# Patient Record
Sex: Female | Born: 1995 | Race: White | Hispanic: No | Marital: Single | Smoking: Never smoker
Health system: Southern US, Community
[De-identification: ages and names within clinical notes are randomized; demographics above are authoritative.]

---

## 2015-05-31 ENCOUNTER — Ambulatory Visit: Payer: Self-pay | Admitting: Osteopathic Medicine

## 2015-06-01 ENCOUNTER — Ambulatory Visit: Payer: Self-pay | Admitting: Osteopathic Medicine

## 2015-11-30 ENCOUNTER — Encounter: Payer: Self-pay | Admitting: Osteopathic Medicine

## 2015-11-30 ENCOUNTER — Ambulatory Visit (INDEPENDENT_AMBULATORY_CARE_PROVIDER_SITE_OTHER): Payer: Managed Care, Other (non HMO) | Admitting: Osteopathic Medicine

## 2015-11-30 VITALS — BP 119/72 | HR 74 | Ht 65.0 in | Wt 149.0 lb

## 2015-11-30 DIAGNOSIS — Z30011 Encounter for initial prescription of contraceptive pills: Secondary | ICD-10-CM | POA: Diagnosis not present

## 2015-11-30 DIAGNOSIS — N3010 Interstitial cystitis (chronic) without hematuria: Secondary | ICD-10-CM | POA: Diagnosis not present

## 2015-11-30 LAB — POCT URINALYSIS DIPSTICK
BILIRUBIN UA: NEGATIVE
Blood, UA: NEGATIVE
GLUCOSE UA: NEGATIVE
Ketones, UA: NEGATIVE
LEUKOCYTES UA: NEGATIVE
NITRITE UA: NEGATIVE
PH UA: 6.5
Protein, UA: NEGATIVE
Spec Grav, UA: 1.025
Urobilinogen, UA: 0.2

## 2015-11-30 LAB — POCT URINE PREGNANCY: PREG TEST UR: NEGATIVE

## 2015-11-30 MED ORDER — AMITRIPTYLINE HCL 50 MG PO TABS: 50.0000 mg | ORAL_TABLET | Freq: Every day | ORAL | Status: DC

## 2015-11-30 MED ORDER — NORGESTIM-ETH ESTRAD TRIPHASIC 0.18/0.215/0.25 MG-25 MCG PO TABS: 1.0000 | ORAL_TABLET | Freq: Every day | ORAL | Status: DC

## 2015-11-30 MED ORDER — PHENAZOPYRIDINE HCL 200 MG PO TABS: 200.0000 mg | ORAL_TABLET | Freq: Three times a day (TID) | ORAL | Status: AC | PRN

## 2015-11-30 NOTE — Progress Notes (Signed)
HPI: Holly Ray is a 20 y.o. female who presents to [facility] today for chief complaint of:  Chief Complaint  Patient presents with  . Establish Care  . Contraception    Discuss contraception: Patient thinking about getting the arm implant, has a few questions about this and other methods. See below for assessment plan  Concern for interstitial cystitis: "issued with bladder" since about age 55 - 42, had to be home-schooled for some time. Saw two different urologists, went to specialist at [facility] and said nothing was wrong. Interstitial cystitis dx from her OBGYN. Was on Oxycodone and muscle relaxer. Never Elavil, antihistamines, pyridium.     Past medical, social and family history reviewed: History reviewed. No pertinent past medical history. History reviewed. No pertinent past surgical history. Social History  Substance Use Topics  . Smoking status: Never Smoker   . Smokeless tobacco: Not on file  . Alcohol Use: Not on file   History reviewed. No pertinent family history.  No current outpatient prescriptions on file.   No current facility-administered medications for this visit.   Allergies  Allergen Reactions  . Ceclor [Cefaclor] Rash      Review of Systems: CONSTITUTIONAL:  No  fever, no chills, No  unintentional weight changes HEAD/EYES/EARS/NOSE/THROAT: No  headache, no vision change, no hearing change, No  sore throat, No  sinus pressure CARDIAC: No  chest pain, No  pressure, No palpitations, No  orthopnea RESPIRATORY: No  cough, No  shortness of breath/wheeze GASTROINTESTINAL: No  nausea, No  vomiting, No  abdominal pain, No  blood in stool, No  diarrhea, No  constipation  MUSCULOSKELETAL: No  myalgia/arthralgia GENITOURINARY: No  incontinence, No  abnormal genital bleeding/discharge, (+) occasional dysuria SKIN: No  rash/wounds/concerning lesions HEM/ONC: No  easy bruising/bleeding, No  abnormal lymph node ENDOCRINE: No  polyuria/polydipsia/polyphagia, No  heat/cold intolerance  NEUROLOGIC: No  weakness, No  dizziness, No  slurred speech PSYCHIATRIC: No  concerns with depression, No  concerns with anxiety, No sleep problems  Exam:  BP 119/72 mmHg  Pulse 74  Ht  (1.651 m)  Wt 149 lb (67.586 kg)  BMI 24.79 kg/m2 Constitutional: VS see above. General Appearance: alert, well-developed, well-nourished, NAD Eyes: Nrmal lids and conjunctive, non-icteric sclera,  Ears, Nose, Mouth, Throat: MMM, Normal external inspection ears/nares/mouth/lips/gums,  Neck: No masses, trachea midline.  Musculoskeletal: Gait normal. No clubbing/cyanosis of digits.  Neurological: No cranial nerve deficit on limited exam.  Skin: warm, dry, intact. No rash/ulcer.  Psychiatric: Normal judgment/insight. Normal mood and affect. Oriented x3.    Results for orders placed or performed in visit on 11/30/15 (from the past 72 hour(s))  POCT urine pregnancy     Status: None   Collection Time: 11/30/15 10:00 AM  Result Value Ref Range   Preg Test, Ur Negative Negative      ASSESSMENT/PLAN: Plan for oral contraceptive pills, will order Nexplanon arm insert, patient advised of possible side effects of this medicine, does not protect against sexually transmitted infections, also educated on other birth control options such as Depo injection, NuvaRing, IUD options   Patient has what sounds like diagnosis of interstitial cystitis, possible inadequate treatment of sounds like she went from Dr. Dr. for some time. Will trial amitriptyline as well as Pyridium when necessary, if this is helping, good, if not, would consider pelvic floor physical therapy and possible urogynecology referral  Encounter for initial prescription of contraceptive pills - Plan: POCT urine pregnancy, phenazopyridine (PYRIDIUM) 200  MG tablet  Chronic interstitial cystitis - Plan: amitriptyline (ELAVIL) 50 MG tablet, Norgestimate-Ethinyl Estradiol Triphasic (ORTHO  TRI-CYCLEN LO) 0.18/0.215/0.25 MG-25 MCG tab, POCT Urinalysis Dipstick   Return sooner if needed, for Nexplanon insertion once this has arrived at the office.

## 2015-11-30 NOTE — Patient Instructions (Addendum)
We are starting birth control pills at this time, planning to order the Nexplanon arm insert, see below for further details about this medication. We will call you when it has arrived in the office and is ready for insertion.  As far as starting birth control pills, ideally would like to start these on the first day of your period. Or within 5 days of the first day of your period.   For presumed diagnosis of interstitial cystitis, prescribed an as needed medication to take for urinary pain, Pyridium, as well as another medication to take every night which will hopefully help with prevention, amitriptyline. We are starting a low-dose of this medication, it can cause sedation/somnolence, please let me know if you're having any problems with this. We'll start at half a tablet every night for 5-7 days and then increase to 1 tablet. We can go higher than the 50 mg but please talk to me before we decide to increase the dose.  Any questions or concerns, please call the office!    Etonogestrel implant What is this medicine? ETONOGESTREL (et oh noe JES trel) is a contraceptive (birth control) device. It is used to prevent pregnancy. It can be used for up to 3 years. This medicine may be used for other purposes; ask your health care provider or pharmacist if you have questions. What should I tell my health care provider before I take this medicine? They need to know if you have any of these conditions: -abnormal vaginal bleeding -blood vessel disease or blood clots -cancer of the breast, cervix, or liver -depression -diabetes -gallbladder disease -headaches -heart disease or recent heart attack -high blood pressure -high cholesterol -kidney disease -liver disease -renal disease -seizures -tobacco smoker -an unusual or allergic reaction to etonogestrel, other hormones, anesthetics or antiseptics, medicines, foods, dyes, or preservatives -pregnant or trying to get pregnant -breast-feeding How  should I use this medicine? This device is inserted just under the skin on the inner side of your upper arm by a health care professional. Talk to your pediatrician regarding the use of this medicine in children. Special care may be needed. Overdosage: If you think you have taken too much of this medicine contact a poison control center or emergency room at once. NOTE: This medicine is only for you. Do not share this medicine with others. What if I miss a dose? This does not apply. What may interact with this medicine? Do not take this medicine with any of the following medications: -amprenavir -bosentan -fosamprenavir This medicine may also interact with the following medications: -barbiturate medicines for inducing sleep or treating seizures -certain medicines for fungal infections like ketoconazole and itraconazole -griseofulvin -medicines to treat seizures like carbamazepine, felbamate, oxcarbazepine, phenytoin, topiramate -modafinil -phenylbutazone -rifampin -some medicines to treat HIV infection like atazanavir, indinavir, lopinavir, nelfinavir, tipranavir, ritonavir -St. John's wort This list may not describe all possible interactions. Give your health care provider a list of all the medicines, herbs, non-prescription drugs, or dietary supplements you use. Also tell them if you smoke, drink alcohol, or use illegal drugs. Some items may interact with your medicine. What should I watch for while using this medicine? This product does not protect you against HIV infection (AIDS) or other sexually transmitted diseases. You should be able to feel the implant by pressing your fingertips over the skin where it was inserted. Contact your doctor if you cannot feel the implant, and use a non-hormonal birth control method (such as condoms) until your doctor confirms that  the implant is in place. If you feel that the implant may have broken or become bent while in your arm, contact your  healthcare provider. What side effects may I notice from receiving this medicine? Side effects that you should report to your doctor or health care professional as soon as possible: -allergic reactions like skin rash, itching or hives, swelling of the face, lips, or tongue -breast lumps -changes in emotions or moods -depressed mood -heavy or prolonged menstrual bleeding -pain, irritation, swelling, or bruising at the insertion site -scar at site of insertion -signs of infection at the insertion site such as fever, and skin redness, pain or discharge -signs of pregnancy -signs and symptoms of a blood clot such as breathing problems; changes in vision; chest pain; severe, sudden headache; pain, swelling, warmth in the leg; trouble speaking; sudden numbness or weakness of the face, arm or leg -signs and symptoms of liver injury like dark yellow or brown urine; general ill feeling or flu-like symptoms; light-colored stools; loss of appetite; nausea; right upper belly pain; unusually weak or tired; yellowing of the eyes or skin -unusual vaginal bleeding, discharge -signs and symptoms of a stroke like changes in vision; confusion; trouble speaking or understanding; severe headaches; sudden numbness or weakness of the face, arm or leg; trouble walking; dizziness; loss of balance or coordination Side effects that usually do not require medical attention (Report these to your doctor or health care professional if they continue or are bothersome.): -acne -back pain -breast pain -changes in weight -dizziness -general ill feeling or flu-like symptoms -headache -irregular menstrual bleeding -nausea -sore throat -vaginal irritation or inflammation This list may not describe all possible side effects. Call your doctor for medical advice about side effects. You may report side effects to FDA at 1-800-FDA-1088. Where should I keep my medicine? This drug is given in a hospital or clinic and will not be  stored at home. NOTE: This sheet is a summary. It may not cover all possible information. If you have questions about this medicine, talk to your doctor, pharmacist, or health care provider.    2016, Elsevier/Gold Standard. (2014-06-05 14:07:06)

## 2015-12-28 ENCOUNTER — Telehealth: Payer: Self-pay

## 2015-12-28 NOTE — Telephone Encounter (Signed)
Patient had called left a message inquiring about Nexplanon insertion. I called her back to advise that we had it here but was unable to speak to patient or leave a message due to patient vm being full or not set up. If patient calls back she is to schedule an appointment for 30 mins to have this done. Rhonda Cunningham,CMA

## 2016-01-18 ENCOUNTER — Ambulatory Visit: Payer: Managed Care, Other (non HMO) | Admitting: Osteopathic Medicine

## 2016-01-27 ENCOUNTER — Ambulatory Visit (INDEPENDENT_AMBULATORY_CARE_PROVIDER_SITE_OTHER): Payer: BLUE CROSS/BLUE SHIELD | Admitting: Osteopathic Medicine

## 2016-01-27 ENCOUNTER — Encounter: Payer: Self-pay | Admitting: Osteopathic Medicine

## 2016-01-27 VITALS — BP 135/81 | HR 81 | Ht 65.0 in | Wt 145.0 lb

## 2016-01-27 DIAGNOSIS — Z30011 Encounter for initial prescription of contraceptive pills: Secondary | ICD-10-CM

## 2016-01-27 MED ORDER — NORGESTREL-ETHINYL ESTRADIOL 0.3-30 MG-MCG PO TABS: 1.0000 | ORAL_TABLET | Freq: Every day | ORAL | Status: DC

## 2016-01-27 NOTE — Patient Instructions (Signed)
Finish current month of pills - on first day of your next period, start the new medicine.

## 2016-01-27 NOTE — Progress Notes (Signed)
HPI: Holly Ray is a 20 y.o. female who presents to [facility] [place] today for chief complaint of:  Chief Complaint  Patient presents with  . Contraception    Discuss contraception: Patient thinking about getting the arm implant, has a few questions about this and other methods. We started Ortho-Tri-Cyclen Lo - causing some moodiness.    Past medical, social and family history reviewed: No past medical history on file. No past surgical history on file. Social History  Substance Use Topics  . Smoking status: Never Smoker   . Smokeless tobacco: Not on file  . Alcohol Use: Not on file   No family history on file.  Current Outpatient Prescriptions  Medication Sig Dispense Refill  . amitriptyline (ELAVIL) 50 MG tablet Take 1 tablet (50 mg total) by mouth at bedtime. (Start at 1/2 tablet every night for 5 - 7 days then increase to 1 tablet every night) 30 tablet 1  . Norgestimate-Ethinyl Estradiol Triphasic (ORTHO TRI-CYCLEN LO) 0.18/0.215/0.25 MG-25 MCG tab Take 1 tablet by mouth daily. 1 Package 11   No current facility-administered medications for this visit.   Allergies  Allergen Reactions  . Ceclor [Cefaclor] Rash      Review of Systems: CONSTITUTIONAL:  No recent illness MUSCULOSKELETAL: No  myalgia/arthralgia GENITOURINARY: No  abnormal genital bleeding/discharge, no crampy/heavy periods PSYCHIATRIC: No  concerns with depression, No  concerns with anxiety, No sleep problems, (+) mood swings, ittirability  Exam:  BP 135/81 mmHg  Pulse 81  Ht 5\' 5"  (1.651 m)  Wt 145 lb (65.772 kg)  BMI 24.13 kg/m2 Constitutional: VS see above. General Appearance: alert, well-developed, well-nourished, NAD Eyes: Nrmal lids and conjunctive, non-icteric sclera,  Ears, Nose, Mouth, Throat: MMM,   Neck: No masses, trachea midline.  Musculoskeletal: Gait normal.  Skin: warm, dry, intact. No rash/ulcer.  Psychiatric: Normal judgment/insight. Normal mood  and affect. Oriented x3.     No results found for this or any previous visit (from the past 72 hour(s)).    ASSESSMENT/PLAN: Patient decided against Nexplanon, but would like to switch OCP. Ok to switch today, advised some moodiness can be expected with any OCP but shouldn't last more than one or two packs. Any questions, let me know. Advised will need Pap age 20.   Encounter for initial prescription of contraceptive pills - Plan: norgestrel-ethinyl estradiol (CRYSELLE-28) 0.3-30 MG-MCG tablet   Return as needed.

## 2016-02-15 ENCOUNTER — Ambulatory Visit (INDEPENDENT_AMBULATORY_CARE_PROVIDER_SITE_OTHER): Payer: BLUE CROSS/BLUE SHIELD | Admitting: Osteopathic Medicine

## 2016-02-15 VITALS — BP 146/79 | HR 85 | Wt 139.0 lb

## 2016-02-15 DIAGNOSIS — L02419 Cutaneous abscess of limb, unspecified: Secondary | ICD-10-CM | POA: Diagnosis not present

## 2016-02-15 DIAGNOSIS — L03119 Cellulitis of unspecified part of limb: Secondary | ICD-10-CM | POA: Diagnosis not present

## 2016-02-15 DIAGNOSIS — N3010 Interstitial cystitis (chronic) without hematuria: Secondary | ICD-10-CM | POA: Diagnosis not present

## 2016-02-15 MED ORDER — OXYCODONE-ACETAMINOPHEN 5-325 MG PO TABS: 1.0000 | ORAL_TABLET | ORAL | Status: DC | PRN

## 2016-02-15 MED ORDER — AMITRIPTYLINE HCL 50 MG PO TABS: 25.0000 mg | ORAL_TABLET | Freq: Every evening | ORAL | Status: DC | PRN

## 2016-02-15 MED ORDER — CLINDAMYCIN HCL 300 MG PO CAPS: 300.0000 mg | ORAL_CAPSULE | Freq: Three times a day (TID) | ORAL | Status: DC

## 2016-02-15 NOTE — Progress Notes (Signed)
HPI: Holly Ray is a 20 y.o. female who presents to [facility] [place] today for chief complaint of:  Chief Complaint  Patient presents with  . Cyst     . Location: posterior thigh below L buttock . Quality: swollen, painful, draining pus and blood . Duration: few days . Modifying factors: able to drain it some at home but no tmuch . Assoc signs/symptoms: no fever/chills or joint pain   Past medical, social and family history reviewed: No past medical history on file. No past surgical history on file. Social History  Substance Use Topics  . Smoking status: Never Smoker   . Smokeless tobacco: Not on file  . Alcohol Use: Not on file   No family history on file.  Current Outpatient Prescriptions  Medication Sig Dispense Refill  . amitriptyline (ELAVIL) 50 MG tablet Take 0.5-1 tablets (25-50 mg total) by mouth at bedtime as needed (interstitial cystitis pain). 30 tablet 6  . norgestrel-ethinyl estradiol (CRYSELLE-28) 0.3-30 MG-MCG tablet Take 1 tablet by mouth daily. 1 Package 11  . clindamycin (CLEOCIN) 300 MG capsule Take 1 capsule (300 mg total) by mouth 3 (three) times daily. 21 capsule 0  . oxyCODONE-acetaminophen (PERCOCET/ROXICET) 5-325 MG tablet Take 1 tablet by mouth every 4 (four) hours as needed for severe pain. 20 tablet 0   No current facility-administered medications for this visit.   Allergies  Allergen Reactions  . Ceclor [Cefaclor] Rash      Review of Systems: CONSTITUTIONAL:  No  fever, no chills, No recent illness,  GASTROINTESTINAL: No  nausea, No  vomiting, No  abdominal pain, MUSCULOSKELETAL: No  myalgia/arthralgia GENITOURINARY: No  incontinence, No  abnormal genital bleeding/discharge SKIN: (+) rash/wounds/concerning lesions  Exam:  BP 146/79 mmHg  Pulse 85  Wt 139 lb (63.05 kg) Constitutional: VS see above. General Appearance: alert, well-developed, well-nourished, NAD Skin: abscess below L buttock, significant  amount of blood and purulent material able to be manually expressed, approx 3 - 4 mL. Skin otherwise warm, dry, intact. No rash/ulcer. No concerning nevi or subq nodules on limited exam.   Psychiatric: Normal judgment/insight. Normal mood and affect. Oriented x3.      ASSESSMENT/PLAN: Pt would rather try po abx and warm compresses than I&D at this point, given that I was able to express a significant amount of fluid/pus from the abscess this may have been sufficient, will trial po abx and warm compresses with pain control, wound culture pending, RTC/ER precautions discussed at length. Pt advised we may be missing deeper pockets of infection without doing I&D and exploration of the abscess, and we may need to do this and place some packing if no better in the next day or so. Pt agreeable to plan and followup as directed.   Cellulitis and abscess of leg - Plan: Wound culture, clindamycin (CLEOCIN) 300 MG capsule, oxyCODONE-acetaminophen (PERCOCET/ROXICET) 5-325 MG tablet  Chronic interstitial cystitis - Plan: amitriptyline (ELAVIL) 50 MG tablet   All questions were answered. Visit summary with medication list and pertinent instructions was printed for patient to review. ER/RTC precautions were reviewed with the patient. Return if symptoms worsen or fail to improve.  Total time spent 25 minutes, greater than 50% of the visit was face to face counseling for diagnosis of abscess.

## 2016-02-15 NOTE — Patient Instructions (Signed)
Abscess An abscess is an infected area that contains a collection of pus and debris.It can occur in almost any part of the body. An abscess is also known as a furuncle or boil. CAUSES  An abscess occurs when tissue gets infected. This can occur from blockage of oil or sweat glands, infection of hair follicles, or a minor injury to the skin. As the body tries to fight the infection, pus collects in the area and creates pressure under the skin. This pressure causes pain. People with weakened immune systems have difficulty fighting infections and get certain abscesses more often.  SYMPTOMS Usually an abscess develops on the skin and becomes a painful mass that is red, warm, and tender. If the abscess forms under the skin, you may feel a moveable soft area under the skin. Some abscesses break open (rupture) on their own, but most will continue to get worse without care. The infection can spread deeper into the body and eventually into the bloodstream, causing you to feel ill.  DIAGNOSIS  Your caregiver will take your medical history and perform a physical exam. A sample of fluid may also be taken from the abscess to determine what is causing your infection. TREATMENT  Your caregiver may prescribe antibiotic medicines to fight the infection. However, taking antibiotics alone usually does not cure an abscess. Your caregiver may need to make a small cut (incision) in the abscess to drain the pus. In some cases, gauze is packed into the abscess to reduce pain and to continue draining the area. HOME CARE INSTRUCTIONS   Only take over-the-counter or prescription medicines for pain, discomfort, or fever as directed by your caregiver: Ibuprofen 400 - 800 mg every 6 hours, use the Oxycodone-Acetaminophen for severe pain.   Use warm compresses directly o the skin several times per day to promote drainage.   If you were prescribed antibiotics, take them as directed. Finish them even if you start to feel  better.  If gauze is used, follow your caregiver's directions for changing the gauze.  To avoid spreading the infection:  Keep your draining abscess covered with a bandage.  Wash your hands well.  Do not share personal care items, towels, or whirlpools with others.  Avoid skin contact with others.  Keep your skin and clothes clean around the abscess.  Keep all follow-up appointments as directed by your caregiver. SEEK MEDICAL CARE IF:   You have increased pain, swelling, redness, fluid drainage, or bleeding.  You have muscle aches, chills, or a general ill feeling.  You have a fever. MAKE SURE YOU:   Understand these instructions.  Will watch your condition.  Will get help right away if you are not doing well or get worse.   This information is not intended to replace advice given to you by your health care provider. Make sure you discuss any questions you have with your health care provider.   Document Released: 05/31/2005 Document Revised: 02/20/2012 Document Reviewed: 11/03/2011 Elsevier Interactive Patient Education [employer]2016 Elsevier Inc.

## 2016-02-24 ENCOUNTER — Telehealth: Payer: Self-pay

## 2016-02-24 MED ORDER — PHENAZOPYRIDINE HCL 200 MG PO TABS: 200.0000 mg | ORAL_TABLET | Freq: Three times a day (TID) | ORAL | Status: DC | PRN

## 2016-02-24 NOTE — Telephone Encounter (Signed)
Spoke to patient advised her that medication has been sent to her pharmacy. Holly Ray,CMA

## 2016-02-24 NOTE — Telephone Encounter (Signed)
Holly Ray called and states she would like Pyridium for her painful urination.

## 2016-02-24 NOTE — Telephone Encounter (Signed)
Sent it in! Let me know if any problems.

## 2016-03-01 ENCOUNTER — Telehealth: Payer: Self-pay

## 2016-03-01 MED ORDER — PHENAZOPYRIDINE HCL 200 MG PO TABS: 200.0000 mg | ORAL_TABLET | Freq: Three times a day (TID) | ORAL | Status: DC | PRN

## 2016-03-01 NOTE — Telephone Encounter (Signed)
Patient called stated that the pharmacy never received Rx for Pyridium 200 mg. So I checked it and it was done under no print tab so I resent it to the pharmacy. Rhonda Cunningham,CMA

## 2016-04-04 ENCOUNTER — Ambulatory Visit: Payer: BLUE CROSS/BLUE SHIELD | Admitting: Osteopathic Medicine

## 2016-12-19 ENCOUNTER — Ambulatory Visit (INDEPENDENT_AMBULATORY_CARE_PROVIDER_SITE_OTHER): Payer: Medicaid Other | Admitting: Osteopathic Medicine

## 2016-12-19 ENCOUNTER — Telehealth: Payer: Self-pay | Admitting: Osteopathic Medicine

## 2016-12-19 ENCOUNTER — Encounter: Payer: Self-pay | Admitting: Osteopathic Medicine

## 2016-12-19 VITALS — BP 126/65 | HR 70 | Ht 65.0 in | Wt 164.0 lb

## 2016-12-19 DIAGNOSIS — Z3009 Encounter for other general counseling and advice on contraception: Secondary | ICD-10-CM

## 2016-12-19 DIAGNOSIS — N3010 Interstitial cystitis (chronic) without hematuria: Secondary | ICD-10-CM

## 2016-12-19 MED ORDER — AMITRIPTYLINE HCL 50 MG PO TABS: 25.0000 mg | ORAL_TABLET | Freq: Every evening | ORAL | 1 refills | Status: DC | PRN

## 2016-12-19 NOTE — Telephone Encounter (Signed)
Most excellent. Can we make sure we have a Nexplanon device around and then schedule the patient? Medicaid used to be tricky about coverage for these so thanks for looking into this.

## 2016-12-19 NOTE — Telephone Encounter (Signed)
Called Medicaid benefits call center [phone]), spoke with Alisia. Was advised Pt has active and full Medicaid coverage and to visit website [email or web address]) and click on Family planning document ([email or web address]) to determine coverage details. Call reference #: [id number].  Per website IUD will be covered:  3.2 Specific Criteria Covered According to 42 C.F.R.  441.20, for Medicaid family planning services: "For beneficiaries eligible under the plan for family planning services, the plan must provide that each beneficiary is free from coercion or mental pressure and free to choose the method of family planning to be used." 3.2.1 Specific Criteria Covered by Medicaid FP, [facility] and "Be Smart" Medicaid FP, [facility] and "Be Smart" shall cover family planning services, including consultation, examination, and treatment prescribed by a physician, nurse midwife, or nurse practitioner, or furnished by or under the physician's supervision. Family planning services include laboratory tests, and FDA approved methods, supplies, and devices to prevent conception, as follows: a. The "fitting" of diaphragms; b. Birth control pills; c. Intrauterine Devices (IUD's) (including Mirena, Paragard, and Skyla); d. Contraceptive injections (including Depo-Provera); e. Implantable contraceptive devices (including Implanon and Nexplanon); f. Contraceptive patch (including Ortho Evra); g. Contraceptive ring (including Nuva Ring); h. Emergency Contraception (including Plan B and Ella); i. Screening, early detection and education for Sexually Transmitted Infections (STIs), including Human Immunodeficiency Virus/ Acquired Immune Deficiency Syndrome (HIV/AIDS); j. Treatment for STIs; and k. Lab services (refer to Attachment A, Section C, Item 1)

## 2016-12-19 NOTE — Telephone Encounter (Signed)
-----   Message from Sunnie Nielsen, DO sent at 12/19/2016  1:55 PM EDT ----- Regarding: Nexplanon Can we see about Medicaid approval for Nexplanon? Thanks

## 2016-12-19 NOTE — Progress Notes (Signed)
HPI: Holly Ray is a 21 y.o. female  who presents to [name] today, 12/19/16,  for chief complaint of:  Chief Complaint  Patient presents with  . Contraception    Patient here for discussion regarding contraception. Is unable to remember to take pills on a daily basis, pregnancy resulted, currently 4 weeks postpartum and would like to have Nexplanon placed. She is also considering IUD.  Bladder pain/history of interstitial cystitis: Previously on amitriptyline and this is very helpful, would like refill of this medication.   Past medical, surgical, social and family history reviewed: Patient Active Problem List   Diagnosis Date Noted  . Cellulitis and abscess of leg 02/15/2016  . Encounter for initial prescription of contraceptive pills 11/30/2015  . Chronic interstitial cystitis 11/30/2015   No past surgical history on file. Social History  Substance Use Topics  . Smoking status: Never Smoker  . Smokeless tobacco: Never Used  . Alcohol use Not on file   No family history on file.   Current medication list and allergy/intolerance information reviewed:   Current Outpatient Prescriptions  Medication Sig Dispense Refill  . amitriptyline (ELAVIL) 50 MG tablet Take 0.5-1 tablets (25-50 mg total) by mouth at bedtime as needed (interstitial cystitis pain). 90 tablet 1   No current facility-administered medications for this visit.    Allergies  Allergen Reactions  . Ceclor [Cefaclor] Rash      Review of Systems:  Constitutional:  No  fever, no chills, No recent illness, feels well today   Genitourinary: No  incontinence, No  abnormal genital bleeding, No abnormal genital discharge  Skin: No  Rash, No other wounds/concerning lesions  Exam:  BP 126/65   Pulse 70   Ht  (1.651 m)   Wt 164 lb (74.4 kg)   BMI 27.29 kg/m   Constitutional: VS see above. General Appearance: alert, well-developed, well-nourished,  NAD  Psychiatric: Normal judgment/insight. Normal mood and affect. Oriented x3.    ASSESSMENT/PLAN:   Discussed options for long-acting reversible contraception including Nexplanon and IUD. Patient is leaning toward Nexplanon. We'll confirm Medicaid approval and plan for insertion of patient's earliest convenience.   Encounter for counseling regarding contraception  Chronic interstitial cystitis - Plan: amitriptyline (ELAVIL) 50 MG tablet, DISCONTINUED: amitriptyline (ELAVIL) 50 MG tablet       Visit summary with medication list and pertinent instructions was printed for patient to review. All questions at time of visit were answered - patient instructed to contact office with any additional concerns. ER/RTC precautions were reviewed with the patient. Follow-up plan: Return for contraception insertion once approved .  Note: Total time spent 15 minutes, greater than 50% of the visit was spent face-to-face counseling and coordinating care for the following: The primary encounter diagnosis was Encounter for counseling regarding contraception. A diagnosis of Chronic interstitial cystitis was also pertinent to this visit.Marland Kitchen

## 2016-12-19 NOTE — Patient Instructions (Addendum)
Etonogestrel implant What is this medicine? ETONOGESTREL (et oh noe JES trel) is a contraceptive (birth control) device. It is used to prevent pregnancy. It can be used for up to 3 years. This medicine may be used for other purposes; ask your health care provider or pharmacist if you have questions. COMMON BRAND NAME(S): Implanon, Nexplanon What should I tell my health care provider before I take this medicine? They need to know if you have any of these conditions: -abnormal vaginal bleeding -blood vessel disease or blood clots -cancer of the breast, cervix, or liver -depression -diabetes -gallbladder disease -headaches -heart disease or recent heart attack -high blood pressure -high cholesterol -kidney disease -liver disease -renal disease -seizures -tobacco smoker -an unusual or allergic reaction to etonogestrel, other hormones, anesthetics or antiseptics, medicines, foods, dyes, or preservatives -pregnant or trying to get pregnant -breast-feeding How should I use this medicine? This device is inserted just under the skin on the inner side of your upper arm by a health care professional. Talk to your pediatrician regarding the use of this medicine in children. Special care may be needed. Overdosage: If you think you have taken too much of this medicine contact a poison control center or emergency room at once. NOTE: This medicine is only for you. Do not share this medicine with others. What if I miss a dose? This does not apply. What may interact with this medicine? Do not take this medicine with any of the following medications: -amprenavir -bosentan -fosamprenavir This medicine may also interact with the following medications: -barbiturate medicines for inducing sleep or treating seizures -certain medicines for fungal infections like ketoconazole and itraconazole -grapefruit juice -griseofulvin -medicines to treat seizures like carbamazepine, felbamate, oxcarbazepine,  phenytoin, topiramate -modafinil -phenylbutazone -rifampin -rufinamide -some medicines to treat HIV infection like atazanavir, indinavir, lopinavir, nelfinavir, tipranavir, ritonavir -St. John's wort This list may not describe all possible interactions. Give your health care provider a list of all the medicines, herbs, non-prescription drugs, or dietary supplements you use. Also tell them if you smoke, drink alcohol, or use illegal drugs. Some items may interact with your medicine. What should I watch for while using this medicine? This product does not protect you against HIV infection (AIDS) or other sexually transmitted diseases. You should be able to feel the implant by pressing your fingertips over the skin where it was inserted. Contact your doctor if you cannot feel the implant, and use a non-hormonal birth control method (such as condoms) until your doctor confirms that the implant is in place. If you feel that the implant may have broken or become bent while in your arm, contact your healthcare provider. What side effects may I notice from receiving this medicine? Side effects that you should report to your doctor or health care professional as soon as possible: -allergic reactions like skin rash, itching or hives, swelling of the face, lips, or tongue -breast lumps -changes in emotions or moods -depressed mood -heavy or prolonged menstrual bleeding -pain, irritation, swelling, or bruising at the insertion site -scar at site of insertion -signs of infection at the insertion site such as fever, and skin redness, pain or discharge -signs of pregnancy -signs and symptoms of a blood clot such as breathing problems; changes in vision; chest pain; severe, sudden headache; pain, swelling, warmth in the leg; trouble speaking; sudden numbness or weakness of the face, arm or leg -signs and symptoms of liver injury like dark yellow or brown urine; general ill feeling or flu-like symptoms;  light-colored   stools; loss of appetite; nausea; right upper belly pain; unusually weak or tired; yellowing of the eyes or skin -unusual vaginal bleeding, discharge -signs and symptoms of a stroke like changes in vision; confusion; trouble speaking or understanding; severe headaches; sudden numbness or weakness of the face, arm or leg; trouble walking; dizziness; loss of balance or coordination Side effects that usually do not require medical attention (report to your doctor or health care professional if they continue or are bothersome): -acne -back pain -breast pain -changes in weight -dizziness -general ill feeling or flu-like symptoms -headache -irregular menstrual bleeding -nausea -sore throat -vaginal irritation or inflammation This list may not describe all possible side effects. Call your doctor for medical advice about side effects. You may report side effects to FDA at 1-800-FDA-1088. Where should I keep my medicine? This drug is given in a hospital or clinic and will not be stored at home. NOTE: This sheet is a summary. It may not cover all possible information. If you have questions about this medicine, talk to your doctor, pharmacist, or health care provider.  2018 Elsevier/Gold Standard (2016-03-09 11:19:22)   Levonorgestrel intrauterine device (IUD) What is this medicine? LEVONORGESTREL IUD (LEE voe nor jes trel) is a contraceptive (birth control) device. The device is placed inside the uterus by a healthcare professional. It is used to prevent pregnancy. This device can also be used to treat heavy bleeding that occurs during your period. This medicine may be used for other purposes; ask your health care provider or pharmacist if you have questions. COMMON BRAND NAME(S): Cameron Ali What should I tell my health care provider before I take this medicine? They need to know if you have any of these conditions: -abnormal Pap smear -cancer of the breast,  uterus, or cervix -diabetes -endometritis -genital or pelvic infection now or in the past -have more than one sexual partner or your partner has more than one partner -heart disease -history of an ectopic or tubal pregnancy -immune system problems -IUD in place -liver disease or tumor -problems with blood clots or take blood-thinners -seizures -use intravenous drugs -uterus of unusual shape -vaginal bleeding that has not been explained -an unusual or allergic reaction to levonorgestrel, other hormones, silicone, or polyethylene, medicines, foods, dyes, or preservatives -pregnant or trying to get pregnant -breast-feeding How should I use this medicine? This device is placed inside the uterus by a health care professional. Talk to your pediatrician regarding the use of this medicine in children. Special care may be needed. Overdosage: If you think you have taken too much of this medicine contact a poison control center or emergency room at once. NOTE: This medicine is only for you. Do not share this medicine with others. What if I miss a dose? This does not apply. Depending on the brand of device you have inserted, the device will need to be replaced every 3 to 5 years if you wish to continue using this type of birth control. What may interact with this medicine? Do not take this medicine with any of the following medications: -amprenavir -bosentan -fosamprenavir This medicine may also interact with the following medications: -aprepitant -armodafinil -barbiturate medicines for inducing sleep or treating seizures -bexarotene -boceprevir -griseofulvin -medicines to treat seizures like carbamazepine, ethotoin, felbamate, oxcarbazepine, phenytoin, topiramate -modafinil -pioglitazone -rifabutin -rifampin -rifapentine -some medicines to treat HIV infection like atazanavir, efavirenz, indinavir, lopinavir, nelfinavir, tipranavir, ritonavir -St. John's wort -warfarin This list may  not describe all possible interactions. Give your health care provider  a list of all the medicines, herbs, non-prescription drugs, or dietary supplements you use. Also tell them if you smoke, drink alcohol, or use illegal drugs. Some items may interact with your medicine. What should I watch for while using this medicine? Visit your doctor or health care professional for regular check ups. See your doctor if you or your partner has sexual contact with others, becomes HIV positive, or gets a sexual transmitted disease. This product does not protect you against HIV infection (AIDS) or other sexually transmitted diseases. You can check the placement of the IUD yourself by reaching up to the top of your vagina with clean fingers to feel the threads. Do not pull on the threads. It is a good habit to check placement after each menstrual period. Call your doctor right away if you feel more of the IUD than just the threads or if you cannot feel the threads at all. The IUD may come out by itself. You may become pregnant if the device comes out. If you notice that the IUD has come out use a backup birth control method like condoms and call your health care provider. Using tampons will not change the position of the IUD and are okay to use during your period. This IUD can be safely scanned with magnetic resonance imaging (MRI) only under specific conditions. Before you have an MRI, tell your healthcare provider that you have an IUD in place, and which type of IUD you have in place. What side effects may I notice from receiving this medicine? Side effects that you should report to your doctor or health care professional as soon as possible: -allergic reactions like skin rash, itching or hives, swelling of the face, lips, or tongue -fever, flu-like symptoms -genital sores -high blood pressure -no menstrual period for 6 weeks during use -pain, swelling, warmth in the leg -pelvic pain or tenderness -severe or  sudden headache -signs of pregnancy -stomach cramping -sudden shortness of breath -trouble with balance, talking, or walking -unusual vaginal bleeding, discharge -yellowing of the eyes or skin Side effects that usually do not require medical attention (report to your doctor or health care professional if they continue or are bothersome): -acne -breast pain -change in sex drive or performance -changes in weight -cramping, dizziness, or faintness while the device is being inserted -headache -irregular menstrual bleeding within first 3 to 6 months of use -nausea This list may not describe all possible side effects. Call your doctor for medical advice about side effects. You may report side effects to FDA at 1-800-FDA-1088. Where should I keep my medicine? This does not apply. NOTE: This sheet is a summary. It may not cover all possible information. If you have questions about this medicine, talk to your doctor, pharmacist, or health care provider.  2018 Elsevier/Gold Standard (2016-06-02 14:14:56)

## 2016-12-20 NOTE — Telephone Encounter (Signed)
Insurance verified, Rx here, Pt scheduled.

## 2016-12-21 ENCOUNTER — Ambulatory Visit: Payer: Medicaid Other | Admitting: Osteopathic Medicine

## 2016-12-22 ENCOUNTER — Ambulatory Visit: Payer: Medicaid Other | Admitting: Osteopathic Medicine

## 2016-12-26 ENCOUNTER — Ambulatory Visit (INDEPENDENT_AMBULATORY_CARE_PROVIDER_SITE_OTHER): Payer: Medicaid Other | Admitting: Osteopathic Medicine

## 2016-12-26 ENCOUNTER — Encounter: Payer: Self-pay | Admitting: Osteopathic Medicine

## 2016-12-26 VITALS — BP 120/73 | HR 81 | Ht 65.0 in | Wt 165.0 lb

## 2016-12-26 DIAGNOSIS — Z308 Encounter for other contraceptive management: Secondary | ICD-10-CM | POA: Diagnosis not present

## 2016-12-26 DIAGNOSIS — Z30017 Encounter for initial prescription of implantable subdermal contraceptive: Secondary | ICD-10-CM

## 2016-12-26 LAB — POCT URINE PREGNANCY: Preg Test, Ur: NEGATIVE

## 2016-12-26 MED ORDER — ETONOGESTREL 68 MG ~~LOC~~ IMPL: 1.0000 | DRUG_IMPLANT | Freq: Once | SUBCUTANEOUS | 0 refills | Status: DC

## 2016-12-26 NOTE — Patient Instructions (Signed)
Etonogestrel implant What is this medicine? ETONOGESTREL (et oh noe JES trel) is a contraceptive (birth control) device. It is used to prevent pregnancy. It can be used for up to 3 years. This medicine may be used for other purposes; ask your health care provider or pharmacist if you have questions. COMMON BRAND NAME(S): Implanon, Nexplanon What should I tell my health care provider before I take this medicine? They need to know if you have any of these conditions: -abnormal vaginal bleeding -blood vessel disease or blood clots -cancer of the breast, cervix, or liver -depression -diabetes -gallbladder disease -headaches -heart disease or recent heart attack -high blood pressure -high cholesterol -kidney disease -liver disease -renal disease -seizures -tobacco smoker -an unusual or allergic reaction to etonogestrel, other hormones, anesthetics or antiseptics, medicines, foods, dyes, or preservatives -pregnant or trying to get pregnant -breast-feeding How should I use this medicine? This device is inserted just under the skin on the inner side of your upper arm by a health care professional. Talk to your pediatrician regarding the use of this medicine in children. Special care may be needed. Overdosage: If you think you have taken too much of this medicine contact a poison control center or emergency room at once. NOTE: This medicine is only for you. Do not share this medicine with others. What if I miss a dose? This does not apply. What may interact with this medicine? Do not take this medicine with any of the following medications: -amprenavir -bosentan -fosamprenavir This medicine may also interact with the following medications: -barbiturate medicines for inducing sleep or treating seizures -certain medicines for fungal infections like ketoconazole and itraconazole -grapefruit juice -griseofulvin -medicines to treat seizures like carbamazepine, felbamate, oxcarbazepine,  phenytoin, topiramate -modafinil -phenylbutazone -rifampin -rufinamide -some medicines to treat HIV infection like atazanavir, indinavir, lopinavir, nelfinavir, tipranavir, ritonavir -St. John's wort This list may not describe all possible interactions. Give your health care provider a list of all the medicines, herbs, non-prescription drugs, or dietary supplements you use. Also tell them if you smoke, drink alcohol, or use illegal drugs. Some items may interact with your medicine. What should I watch for while using this medicine? This product does not protect you against HIV infection (AIDS) or other sexually transmitted diseases. You should be able to feel the implant by pressing your fingertips over the skin where it was inserted. Contact your doctor if you cannot feel the implant, and use a non-hormonal birth control method (such as condoms) until your doctor confirms that the implant is in place. If you feel that the implant may have broken or become bent while in your arm, contact your healthcare provider. What side effects may I notice from receiving this medicine? Side effects that you should report to your doctor or health care professional as soon as possible: -allergic reactions like skin rash, itching or hives, swelling of the face, lips, or tongue -breast lumps -changes in emotions or moods -depressed mood -heavy or prolonged menstrual bleeding -pain, irritation, swelling, or bruising at the insertion site -scar at site of insertion -signs of infection at the insertion site such as fever, and skin redness, pain or discharge -signs of pregnancy -signs and symptoms of a blood clot such as breathing problems; changes in vision; chest pain; severe, sudden headache; pain, swelling, warmth in the leg; trouble speaking; sudden numbness or weakness of the face, arm or leg -signs and symptoms of liver injury like dark yellow or brown urine; general ill feeling or flu-like symptoms;  light-colored   stools; loss of appetite; nausea; right upper belly pain; unusually weak or tired; yellowing of the eyes or skin -unusual vaginal bleeding, discharge -signs and symptoms of a stroke like changes in vision; confusion; trouble speaking or understanding; severe headaches; sudden numbness or weakness of the face, arm or leg; trouble walking; dizziness; loss of balance or coordination Side effects that usually do not require medical attention (report to your doctor or health care professional if they continue or are bothersome): -acne -back pain -breast pain -changes in weight -dizziness -general ill feeling or flu-like symptoms -headache -irregular menstrual bleeding -nausea -sore throat -vaginal irritation or inflammation This list may not describe all possible side effects. Call your doctor for medical advice about side effects. You may report side effects to FDA at 1-800-FDA-1088. Where should I keep my medicine? This drug is given in a hospital or clinic and will not be stored at home. NOTE: This sheet is a summary. It may not cover all possible information. If you have questions about this medicine, talk to your doctor, pharmacist, or health care provider.  2018 Elsevier/Gold Standard (2016-03-09 11:19:22)  

## 2016-12-26 NOTE — Progress Notes (Signed)
HPI: Holly Ray is a 21 y.o. female  who presents to [name] today, 12/26/16,  for chief complaint of:  Chief Complaint  Patient presents with  . Contraception    NEXPLANON    Here for Nexplanon insertion, see procedure note below.   Past medical history, surgical history, social history and family history reviewed.  Patient Active Problem List   Diagnosis Date Noted  . Cellulitis and abscess of leg 02/15/2016  . Encounter for initial prescription of contraceptive pills 11/30/2015  . Chronic interstitial cystitis 11/30/2015    Current medication list and allergy/intolerance information reviewed.   Current Outpatient Prescriptions on File Prior to Visit  Medication Sig Dispense Refill  . amitriptyline (ELAVIL) 50 MG tablet Take 0.5-1 tablets (25-50 mg total) by mouth at bedtime as needed (interstitial cystitis pain). 90 tablet 1   No current facility-administered medications on file prior to visit.    Allergies  Allergen Reactions  . Ceclor [Cefaclor] Rash      Review of Systems:  Constitutional: No recent illness, feels well today   HEENT: No  headache, no vision change  Neurologic: No  weakness, No  Dizziness   Exam:  BP 120/73   Pulse 81   Ht  (1.651 m)   Wt 165 lb (74.8 kg)   BMI 27.46 kg/m   Constitutional: VS see above. General Appearance: alert, well-developed, well-nourished, NAD  Skin: warm, dry, intact.     Recent Results (from the past 2160 hour(s))  POCT urine pregnancy     Status: None   Collection Time: 12/26/16 11:11 AM  Result Value Ref Range   Preg Test, Ur Negative Negative     ASSESSMENT/PLAN:    Insertion of Nexplanon - Consent on file, card provided and after-care instructions reviewed. Pt to contact us with any questions/concerns. Advised backup method contrception x7 days - Plan: etonogestrel (NEXPLANON) 68 MG IMPL implant  Encounter for other contraceptive management - Plan:  POCT urine pregnancy    Follow-up plan: Return for any concern for infection or other problems with the device, otherwise in 3 years for removal.  Visit summary with medication list and pertinent instructions was printed for patient to review, alert [place] if any changes needed. All questions at time of visit were answered - patient instructed to contact office with any additional concerns. ER/RTC precautions were reviewed with the patient and understanding verbalized.   Note: Total time spent 15 minutes, greater than 50% of the visit was spent face-to-face counseling and coordinating care for the following: The encounter diagnosis was Encounter for other contraceptive management.Marland Kitchen   NEXPLANON INSERTION PRE-OP DIAGNOSIS: desired long-term, reversible contraception  POST-OP DIAGNOSIS: Same  PROCEDURE: Nexplanon  placement Performing Physician: Dr. Sunnie Nielsen  Risks and benefits of procedure and of this method of contraception reviewed with the patient. Informed consent obtained, patient opts to proceed today with Nexplanon insertion.    PROCEDURE:  Site (check): left arm  Lot # [id number] 01/2019 Sterile Preparation: Chlorhexidine Negative pregnancy test was confirmed prior to insertoin Insertion site was selected 8 - 10 cm from medial epicondyle taking care to avoid the sulcus between biceps and triceps groove, and marked along with guiding site using sterile marker  Procedure area was prepped and draped in a sterile fashion. _ mL of 1% bupivicaine without epinephrine used for subcutaneous anesthesia. Anesthesia confirmed.  Nexplanon  trocar was inserted subcutaneously and Holly Ray  capsule delivered subcutaneously Trocar was removed from the insertion  site. Nexplanon  capsule was palpated by provider and patient to assure satisfactory placement. Estimated blood loss <1 mL Dressings applied: Steri-Strip and small pressure bandage Followup: The patient tolerated the  procedure well without complications.  Standard post-procedure care is explained and return precautions are given.

## 2017-01-31 ENCOUNTER — Telehealth: Payer: Self-pay

## 2017-01-31 MED ORDER — NORGESTIMATE-ETH ESTRADIOL 0.25-35 MG-MCG PO TABS: 1.0000 | ORAL_TABLET | Freq: Every day | ORAL | 2 refills | Status: DC

## 2017-01-31 NOTE — Telephone Encounter (Signed)
Irregular bleeding or prolonged bleeding is a fairly common side effect of this implant - about 18% of patients. Usually we can manage this with birth control pills for 1-3 months. I've sent some in to her pharmacy and if the bleeding persists or gets worse she should come in to the office for a pelvic exam and further evaluation.

## 2017-01-31 NOTE — Telephone Encounter (Signed)
Called the patient to give advise as noted below, but a recording came on that stated the number I called has been changed or disconnected. Will wait for the patient to call back and I will give her advise as well as get an updated phone number. Rhonda Cunningham,CMA

## 2017-01-31 NOTE — Telephone Encounter (Signed)
Patient called stated that she got the Nexplanon inserted a month ago and she has been bleeding every since. Patient wants advise on what she should do at this point. Please advise. Rhonda Cunningham,CMA

## 2017-02-09 ENCOUNTER — Ambulatory Visit: Payer: Medicaid Other | Admitting: Osteopathic Medicine

## 2017-02-09 DIAGNOSIS — Z0189 Encounter for other specified special examinations: Secondary | ICD-10-CM

## 2017-02-20 ENCOUNTER — Encounter: Payer: Self-pay | Admitting: Osteopathic Medicine

## 2017-02-20 ENCOUNTER — Ambulatory Visit (INDEPENDENT_AMBULATORY_CARE_PROVIDER_SITE_OTHER): Payer: Self-pay | Admitting: Osteopathic Medicine

## 2017-02-20 VITALS — BP 128/87 | HR 96 | Ht 65.0 in | Wt 163.0 lb

## 2017-02-20 DIAGNOSIS — Z30430 Encounter for insertion of intrauterine contraceptive device: Secondary | ICD-10-CM

## 2017-02-20 DIAGNOSIS — Z3046 Encounter for surveillance of implantable subdermal contraceptive: Secondary | ICD-10-CM

## 2017-02-20 NOTE — Patient Instructions (Addendum)
IUD AFTER-CARE INSTRUCTIONS: READ THOROUGHLY  Your Paragard IUD is currently approved to remain in place for 10 years. At that time, if you wish to receive a new IUD, this can be placed when your current one is removed. If you wish to remove your IUD at any time, for any reason, this can be done easily by your doctor.   You should feel for the strings to your IUD routinely. If you cannot locate the strings, it is recommended you alert your doctor of this. This may indicate the IUD has fallen out or has perforated the uterus, which typically does not cause major problems but needs to be evaluated to avoid complications.   Be aware that in the first few weeks, your new IUD may cause some discomfort/cramping as it settles into place in your uterus, and you will likely experience bleeding. To ease discomfort, you may apply heating pad to abdomen and take Ibuprofen 800mg  by mouth every 6 hours as needed, but avoid using this dose continuously for more than 5 days. Walking helps as well. Irregular bleeding should not be heavy for more than a few days.   If pain is severe or if severe bleeding occurs, or if foul-smelling discharge or fever develops, or if you have any other concerns - contact your doctor right away or go to the Emergency Room.  IUD's are a very reliable method of birth control, but no method is 100% effective. If you think you may be pregnant, see your doctor right away.   An IUD will not protect you from sexually transmitted infections such as HIV, gonorrhea, chlamydia, HPV and others.   It is recommended that you see your doctor as directed for routine well-woman care, which includes Pap testing and may include screening for infections.      NEXPLANON REMOVAL AFTER-CARE INSTRUCTIONS  Leave Steri-Strips in place until these come of fon their own. Keep them dry as possible. Can remove the pressure bandage after 24 hours. If persistent bleeding, or signs if infectio n(oozing, redness,  pain), please come see us!

## 2017-02-20 NOTE — Progress Notes (Signed)
HPI: Holly Ray is a 21 y.o. female  who presents to [name] today, 02/20/17,  for chief complaint of:  Chief Complaint  Patient presents with  . Removal of arm implant contraception and insertion of nonhor    Doesn't like the Nexplanon - irregular bleeding. Had a friend who was on hormonal IUD contraception which dried up milk supply, pt does not want this to happen. She would prefer something without hormones.   Discussed IUD placement procedure, reasons we would abort the procedure, risks vs benefits and patient opts to proceed with IUD insertion  Discussed Nexplanon removal procedure, risks versus benefits and patient opts to proceed with removal   Past medical history, surgical history, social history and family history reviewed.  Patient Active Problem List   Diagnosis Date Noted  . Insertion of Nexplanon 12/26/2016  . Cellulitis and abscess of leg 02/15/2016  . Encounter for initial prescription of contraceptive pills 11/30/2015  . Chronic interstitial cystitis 11/30/2015    Current medication list and allergy/intolerance information reviewed.   Current Outpatient Prescriptions on File Prior to Visit  Medication Sig Dispense Refill  . amitriptyline (ELAVIL) 50 MG tablet Take 0.5-1 tablets (25-50 mg total) by mouth at bedtime as needed (interstitial cystitis pain). 90 tablet 1  . etonogestrel (NEXPLANON) 68 MG IMPL implant 1 each (68 mg total) by Subdermal route once. 1 each 0   No current facility-administered medications on file prior to visit.    Allergies  Allergen Reactions  . Ceclor [Cefaclor] Rash      Review of Systems:  Constitutional: No recent illness  Gastrointestinal: No  abdominal pain, no change on bowel habits  GU: Irregular vaginal bleeding, no pelvic pain or discharge  Exam:  BP 128/87   Pulse 96   Ht 5\' 5"  (1.651 m)   Wt 163 lb (73.9 kg)   BMI 27.12 kg/m   Constitutional: VS see above. General  Appearance: alert, well-developed, well-nourished, NAD  Skin: warm, dry, intact.   GU: see procedure note  Recent Results (from the past 2160 hour(s))  POCT urine pregnancy     Status: None   Collection Time: 12/26/16 11:11 AM  Result Value Ref Range   Preg Test, Ur Negative Negative     ASSESSMENT/PLAN:   Encounter for IUD insertion  Encounter for Nexplanon removal   Nexplanon Removal Procedure Note PRE-OP DIAGNOSIS: Nexplanon, desire for change of contraception  POST-OP DIAGNOSIS: Same  PROCEDURE: Nexplanon Removal  Performing Physician:Vince Ainsley PROCEDURE:  Anesthesia: 2% Lidocaine w/ epinephrine 5 ml  Procedure: Consent obtained. A time-out was performed prior to initiating procedure to be sure of right patient and right location. The area surrounding the Nexplanon was prepared in the usual sterile manner. The site was anesthetized with lidocaine. A skin incision was made over the distal aspect of the device. The capsule lysed sharply and the device removed using a hemostat. Hemostasis was assured. The site was dressed with SteriStrips. The patient tolerated the procedure well.  Followup: The patient tolerated the procedure well without complications. Standard post-procedure care is explained and return precautions are given. Contraception is advised until conception is desired - IUD insertion as below     IUD INSERTION PROCEDURE NOTE  PERTINENT RESULTS REVIEWED: PREGNANCY TEST PRIOR TO PROCEDURE: n/a - on nexplanon GONORRHEA/CHLAMYDIA SCREEN: low risk  PRIOR TO PROCEDURE: INFORMED CONSENT OBTAINED: yes  ANY PRETREATMENT: no  PHYSICAL EXAM: GYN: No lesions/ulcers to external genitalia, normal urethra, normal vaginal mucosa, physiologic discharge, cervix  normal without lesions, uterus not enlarged or tender, adnexa no masses and nontender  DESCRIPTION OF PROCEDURE: Vaginal speculum placed. Cervix and proximal vagina cleaned with Betadine.Tenaculum applied at 12:00  cervical position and gentle traction applied. Uterus sounded to 6 cm. IUD placed without difficulty. IUD threads cut to 2-3cm from cervical os. Tenaculum and speculum removed. Patient felt strings. Patient tolerated procedure well. Sterile technique maintained.   IUD INFORMATION: BRAND: Paragard CARD GIVEN TO PATIENT: yes   Patient Instructions  IUD AFTER-CARE INSTRUCTIONS: READ THOROUGHLY  Your Paragard IUD is currently approved to remain in place for 10 years. At that time, if you wish to receive a new IUD, this can be placed when your current one is removed. If you wish to remove your IUD at any time, for any reason, this can be done easily by your doctor.   You should feel for the strings to your IUD routinely. If you cannot locate the strings, it is recommended you alert your doctor of this. This may indicate the IUD has fallen out or has perforated the uterus, which typically does not cause major problems but needs to be evaluated to avoid complications.   Be aware that in the first few weeks, your new IUD may cause some discomfort/cramping as it settles into place in your uterus, and you will likely experience bleeding. To ease discomfort, you may apply heating pad to abdomen and take Ibuprofen 800mg  by mouth every 6 hours as needed, but avoid using this dose continuously for more than 5 days. Walking helps as well. Irregular bleeding should not be heavy for more than a few days.   If pain is severe or if severe bleeding occurs, or if foul-smelling discharge or fever develops, or if you have any other concerns - contact your doctor right away or go to the Emergency Room.  IUD's are a very reliable method of birth control, but no method is 100% effective. If you think you may be pregnant, see your doctor right away.   An IUD will not protect you from sexually transmitted infections such as HIV, gonorrhea, chlamydia, HPV and others.   It is recommended that you see your doctor as directed  for routine well-woman care, which includes Pap testing and may include screening for infections.      NEXPLANON REMOVAL AFTER-CARE INSTRUCTIONS  Leave Steri-Strips in place until these come of fon their own. Keep them dry as possible. Can remove the pressure bandage after 24 hours. If persistent bleeding, or signs if infectio n(oozing, redness, pain), please come see us!     Follow-up plan: Return in about 4 weeks (around 03/20/2017) for check IUD strings, soner if needed.  Visit summary with medication list and pertinent instructions was printed for patient to review, alert us if any changes needed. All questions at time of visit were answered - patient instructed to contact office with any additional concerns. ER/RTC precautions were reviewed with the patient and understanding verbalized.

## 2017-03-20 ENCOUNTER — Ambulatory Visit: Payer: Medicaid Other | Admitting: Osteopathic Medicine

## 2017-03-20 DIAGNOSIS — Z0189 Encounter for other specified special examinations: Secondary | ICD-10-CM

## 2017-08-16 ENCOUNTER — Ambulatory Visit (INDEPENDENT_AMBULATORY_CARE_PROVIDER_SITE_OTHER): Payer: Medicaid Other | Admitting: Osteopathic Medicine

## 2017-08-16 ENCOUNTER — Encounter: Payer: Self-pay | Admitting: Osteopathic Medicine

## 2017-08-16 VITALS — BP 118/70 | HR 70 | Wt 162.8 lb

## 2017-08-16 DIAGNOSIS — O9989 Other specified diseases and conditions complicating pregnancy, childbirth and the puerperium: Secondary | ICD-10-CM

## 2017-08-16 DIAGNOSIS — R1030 Lower abdominal pain, unspecified: Secondary | ICD-10-CM

## 2017-08-16 DIAGNOSIS — Z331 Pregnant state, incidental: Secondary | ICD-10-CM | POA: Insufficient documentation

## 2017-08-16 DIAGNOSIS — T8331XA Breakdown (mechanical) of intrauterine contraceptive device, initial encounter: Secondary | ICD-10-CM

## 2017-08-16 DIAGNOSIS — T8332XA Displacement of intrauterine contraceptive device, initial encounter: Secondary | ICD-10-CM

## 2017-08-16 LAB — POCT URINE PREGNANCY: PREG TEST UR: POSITIVE — AB

## 2017-08-16 NOTE — Progress Notes (Signed)
HPI: Holly Ray is a 21 y.o. female who  has no past medical history on file.  [facility], 08/16/17,  for chief complaint of: IUD problem   Placed Paragard IUD 02/2017. Here today c/o feeling of lower abdominal discomfort described as fluttering, would like strings trimmed d/t partner's discomfort. Would like pregnancy test just to be safe.   LMP 2 months ago. Partner has been feeling the discomfort since about a month after IUD placement but patient has not come in for evaluation until today and she did not follow up for IUD string check...     Past medical, surgical, social and family history reviewed:  Patient Active Problem List   Diagnosis Date Noted  . Cellulitis and abscess of leg 02/15/2016  . Chronic interstitial cystitis 11/30/2015     No past surgical history on file.  Social History   Tobacco Use  . Smoking status: Never Smoker  . Smokeless tobacco: Never Used  Substance Use Topics  . Alcohol use: Not on file    No family history on file.   Current medication list and allergy/intolerance information reviewed:    No current outpatient medications on file.   No current facility-administered medications for this visit.     Allergies  Allergen Reactions  . Ceclor [Cefaclor] Rash      Review of Systems:  Constitutional:  No  fever, no chills  Cardiac: No  chest pain  Respiratory:  No  shortness of breath. No  Cough  Gastrointestinal: +abdominal pain, No  nausea, No  vomiting,  No  blood in stool, No  diarrhea, No  constipation   Musculoskeletal: No new myalgia/arthralgia  Genitourinary: No  incontinence, No  abnormal genital bleeding, No abnormal genital discharge  Skin: No  Rash   Exam:  BP 118/70   Pulse 70   Wt 162 lb 12.8 oz (73.8 kg)   BMI 27.09 kg/m   Constitutional: VS see above. General Appearance: alert, well-developed, well-nourished, NAD  Neck: No masses, trachea  midline.  Respiratory: Normal respiratory effort.   Gastrointestinal: Nontender, no masses.  Musculoskeletal: Gait normal.   Neurological: Normal balance/coordination. No tremor.   Skin: warm, dry, intact. No rash/ulcer.    Psychiatric: Normal judgment/insight. Normal mood and affect. Oriented x3.  GYN: No lesions/ulcers to external genitalia, normal urethra, normal vaginal mucosa, physiologic discharge, cervix normal without lesions, tail of IUD partially visible at cervical os on direct visualization     Results for orders placed or performed in visit on 08/16/17 (from the past 72 hour(s))  POCT urine pregnancy     Status: Abnormal   Collection Time: 08/16/17  9:28 AM  Result Value Ref Range   Preg Test, Ur Positive (A) Negative      ASSESSMENT/PLAN: IUD partial expulsion likely cause of pregnancy, likely IUP, no s/s ectopic but will get US for dates and confirm IUP. Pt is unsure if she wants to keep the pregnancy or discuss termination. Will go ahead and refer to OBGYN so that's in place, I advised patient no judgment here - if any questions or anything I can help with please let me know.   IUD failure, pregnant - Plan: Ambulatory referral to Obstetrics / Gynecology, US OP OB Comp Less 14 Wks  Malpositioned intrauterine device (IUD), initial encounter  Lower abdominal pain - Plan: POCT urine pregnancy     Visit summary with medication list and pertinent instructions was printed for patient to review.  All questions at time of visit were answered - patient instructed to contact office with any additional concerns. ER/RTC precautions were reviewed with the patient.   Follow-up plan: Return if symptoms worsen or fail to improve.    Please note: voice recognition software was used to produce this document, and typos may escape review. Please contact Dr. Lyn HollingsheadAlexander for any needed clarifications.

## 2017-08-17 ENCOUNTER — Other Ambulatory Visit: Payer: Self-pay | Admitting: Osteopathic Medicine

## 2017-08-17 ENCOUNTER — Ambulatory Visit (INDEPENDENT_AMBULATORY_CARE_PROVIDER_SITE_OTHER): Payer: Self-pay

## 2017-08-17 ENCOUNTER — Other Ambulatory Visit: Payer: Self-pay

## 2017-08-17 DIAGNOSIS — Z3201 Encounter for pregnancy test, result positive: Secondary | ICD-10-CM

## 2017-08-17 NOTE — Progress Notes (Signed)
(+)  UPT IUD removed (was in cervix anyway) No IUP on US bHCG ordered  See phone note

## 2017-08-18 LAB — HCG, QUANTITATIVE, PREGNANCY: HCG, TOTAL, QN: 1631 m[IU]/mL

## 2017-08-20 ENCOUNTER — Other Ambulatory Visit: Payer: Self-pay | Admitting: Osteopathic Medicine

## 2017-08-20 DIAGNOSIS — Z3A01 Less than 8 weeks gestation of pregnancy: Secondary | ICD-10-CM

## 2017-08-20 NOTE — Progress Notes (Signed)
Quant hcg order placed

## 2017-08-23 LAB — HCG, QUANTITATIVE, PREGNANCY: HCG, Total, QN: 7694 m[IU]/mL

## 2017-11-13 ENCOUNTER — Ambulatory Visit (INDEPENDENT_AMBULATORY_CARE_PROVIDER_SITE_OTHER): Payer: Medicaid Other | Admitting: Obstetrics and Gynecology

## 2017-11-13 ENCOUNTER — Encounter: Payer: Self-pay | Admitting: Obstetrics and Gynecology

## 2017-11-13 DIAGNOSIS — Z124 Encounter for screening for malignant neoplasm of cervix: Secondary | ICD-10-CM

## 2017-11-13 DIAGNOSIS — Z113 Encounter for screening for infections with a predominantly sexual mode of transmission: Secondary | ICD-10-CM

## 2017-11-13 DIAGNOSIS — Z3687 Encounter for antenatal screening for uncertain dates: Secondary | ICD-10-CM

## 2017-11-13 DIAGNOSIS — Z3482 Encounter for supervision of other normal pregnancy, second trimester: Secondary | ICD-10-CM | POA: Diagnosis not present

## 2017-11-13 DIAGNOSIS — Z3480 Encounter for supervision of other normal pregnancy, unspecified trimester: Secondary | ICD-10-CM | POA: Insufficient documentation

## 2017-11-13 MED ORDER — FAMOTIDINE 20 MG PO TABS: 20.0000 mg | ORAL_TABLET | Freq: Two times a day (BID) | ORAL | 3 refills | Status: DC

## 2017-11-13 NOTE — Progress Notes (Signed)
  Bedside side abd U/S shows single IUP with FHT of 144 BPM and HC measures [id number]

## 2017-11-13 NOTE — Patient Instructions (Addendum)
 Second Trimester of Pregnancy The second trimester is from week 14 through week 27 (months 4 through 6). The second trimester is often a time when you feel your best. Your body has adjusted to being pregnant, and you begin to feel better physically. Usually, morning sickness has lessened or quit completely, you may have more energy, and you may have an increase in appetite. The second trimester is also a time when the fetus is growing rapidly. At the end of the sixth month, the fetus is about 9 inches long and weighs about 1 pounds. You will likely begin to feel the baby move (quickening) between 16 and 20 weeks of pregnancy. Body changes during your second trimester Your body continues to go through many changes during your second trimester. The changes vary from woman to woman.  Your weight will continue to increase. You will notice your lower abdomen bulging out.  You may begin to get stretch marks on your hips, abdomen, and breasts.  You may develop headaches that can be relieved by medicines. The medicines should be approved by your health care provider.  You may urinate more often because the fetus is pressing on your bladder.  You may develop or continue to have heartburn as a result of your pregnancy.  You may develop constipation because certain hormones are causing the muscles that push waste through your intestines to slow down.  You may develop hemorrhoids or swollen, bulging veins (varicose veins).  You may have back pain. This is caused by: ? Weight gain. ? Pregnancy hormones that are relaxing the joints in your pelvis. ? A shift in weight and the muscles that support your balance.  Your breasts will continue to grow and they will continue to become tender.  Your gums may bleed and may be sensitive to brushing and flossing.  Dark spots or blotches (chloasma, mask of pregnancy) may develop on your face. This will likely fade after the baby is born.  A dark line from  your belly button to the pubic area (linea nigra) may appear. This will likely fade after the baby is born.  You may have changes in your hair. These can include thickening of your hair, rapid growth, and changes in texture. Some women also have hair loss during or after pregnancy, or hair that feels dry or thin. Your hair will most likely return to normal after your baby is born.  What to expect at prenatal visits During a routine prenatal visit:  You will be weighed to make sure you and the fetus are growing normally.  Your blood pressure will be taken.  Your abdomen will be measured to track your baby's growth.  The fetal heartbeat will be listened to.  Any test results from the previous visit will be discussed.  Your health care provider may ask you:  How you are feeling.  If you are feeling the baby move.  If you have had any abnormal symptoms, such as leaking fluid, bleeding, severe headaches, or abdominal cramping.  If you are using any tobacco products, including cigarettes, chewing tobacco, and electronic cigarettes.  If you have any questions.  Other tests that may be performed during your second trimester include:  Blood tests that check for: ? Low iron levels (anemia). ? High blood sugar that affects pregnant women (gestational diabetes) between 24 and 28 weeks. ? Rh antibodies. This is to check for a protein on red blood cells (Rh factor).  Urine tests to check for infections, diabetes,   or protein in the urine.  An ultrasound to confirm the proper growth and development of the baby.  An amniocentesis to check for possible genetic problems.  Fetal screens for spina bifida and Down syndrome.  HIV (human immunodeficiency virus) testing. Routine prenatal testing includes screening for HIV, unless you choose not to have this test.  Follow these instructions at home: Medicines  Follow your health care provider's instructions regarding medicine use. Specific  medicines may be either safe or unsafe to take during pregnancy.  Take a prenatal vitamin that contains at least 600 micrograms (mcg) of folic acid.  If you develop constipation, try taking a stool softener if your health care provider approves. Eating and drinking  Eat a balanced diet that includes fresh fruits and vegetables, whole grains, good sources of protein such as meat, eggs, or tofu, and low-fat dairy. Your health care provider will help you determine the amount of weight gain that is right for you.  Avoid raw meat and uncooked cheese. These carry germs that can cause birth defects in the baby.  If you have low calcium intake from food, talk to your health care provider about whether you should take a daily calcium supplement.  Limit foods that are high in fat and processed sugars, such as fried and sweet foods.  To prevent constipation: ? Drink enough fluid to keep your urine clear or pale yellow. ? Eat foods that are high in fiber, such as fresh fruits and vegetables, whole grains, and beans. Activity  Exercise only as directed by your health care provider. Most women can continue their usual exercise routine during pregnancy. Try to exercise for 30 minutes at least 5 days a week. Stop exercising if you experience uterine contractions.  Avoid heavy lifting, wear low heel shoes, and practice good posture.  A sexual relationship may be continued unless your health care provider directs you otherwise. Relieving pain and discomfort  Wear a good support bra to prevent discomfort from breast tenderness.  Take warm sitz baths to soothe any pain or discomfort caused by hemorrhoids. Use hemorrhoid cream if your health care provider approves.  Rest with your legs elevated if you have leg cramps or low back pain.  If you develop varicose veins, wear support hose. Elevate your feet for 15 minutes, 3-4 times a day. Limit salt in your diet. Prenatal Care  Write down your questions.  Take them to your prenatal visits.  Keep all your prenatal visits as told by your health care provider. This is important. Safety  Wear your seat belt at all times when driving.  Make a list of emergency phone numbers, including numbers for family, friends, the hospital, and police and fire departments. General instructions  Ask your health care provider for a referral to a local prenatal education class. Begin classes no later than the beginning of month 6 of your pregnancy.  Ask for help if you have counseling or nutritional needs during pregnancy. Your health care provider can offer advice or refer you to specialists for help with various needs.  Do not use hot tubs, steam rooms, or saunas.  Do not douche or use tampons or scented sanitary pads.  Do not cross your legs for long periods of time.  Avoid cat litter boxes and soil used by cats. These carry germs that can cause birth defects in the baby and possibly loss of the fetus by miscarriage or stillbirth.  Avoid all smoking, herbs, alcohol, and unprescribed drugs. Chemicals in these products   can affect the formation and growth of the baby.  Do not use any products that contain nicotine or tobacco, such as cigarettes and e-cigarettes. If you need help quitting, ask your health care provider.  Visit your dentist if you have not gone yet during your pregnancy. Use a soft toothbrush to brush your teeth and be gentle when you floss. Contact a health care provider if:  You have dizziness.  You have mild pelvic cramps, pelvic pressure, or nagging pain in the abdominal area.  You have persistent nausea, vomiting, or diarrhea.  You have a bad smelling vaginal discharge.  You have pain when you urinate. Get help right away if:  You have a fever.  You are leaking fluid from your vagina.  You have spotting or bleeding from your vagina.  You have severe abdominal cramping or pain.  You have rapid weight gain or weight  loss.  You have shortness of breath with chest pain.  You notice sudden or extreme swelling of your face, hands, ankles, feet, or legs.  You have not felt your baby move in over an hour.  You have severe headaches that do not go away when you take medicine.  You have vision changes. Summary  The second trimester is from week 14 through week 27 (months 4 through 6). It is also a time when the fetus is growing rapidly.  Your body goes through many changes during pregnancy. The changes vary from woman to woman.  Avoid all smoking, herbs, alcohol, and unprescribed drugs. These chemicals affect the formation and growth your baby.  Do not use any tobacco products, such as cigarettes, chewing tobacco, and e-cigarettes. If you need help quitting, ask your health care provider.  Contact your health care provider if you have any questions. Keep all prenatal visits as told by your health care provider. This is important. This information is not intended to replace advice given to you by your health care provider. Make sure you discuss any questions you have with your health care provider. Document Released: 08/15/2001 Document Revised: 09/26/2016 Document Reviewed: 09/26/2016 [employer] Interactive Patient Education  2018 [employer]   Contraception Choices Contraception, also called birth control, refers to methods or devices that prevent pregnancy. Hormonal methods Contraceptive implant A contraceptive implant is a thin, plastic tube that contains a hormone. It is inserted into the upper part of the arm. It can remain in place for up to 3 years. Progestin-only injections Progestin-only injections are injections of progestin, a synthetic form of the hormone progesterone. They are given every 3 months by a health care provider. Birth control pills Birth control pills are pills that contain hormones that prevent pregnancy. They must be taken once a day, preferably at the same time each  day. Birth control patch The birth control patch contains hormones that prevent pregnancy. It is placed on the skin and must be changed once a week for three weeks and removed on the fourth week. A prescription is needed to use this method of contraception. Vaginal ring A vaginal ring contains hormones that prevent pregnancy. It is placed in the vagina for three weeks and removed on the fourth week. After that, the process is repeated with a new ring. A prescription is needed to use this method of contraception. Emergency contraceptive Emergency contraceptives prevent pregnancy after unprotected sex. They come in pill form and can be taken up to 5 days after sex. They work best the sooner they are taken after having sex. Most emergency   contraceptives are available without a prescription. This method should not be used as your only form of birth control. Barrier methods Female condom A female condom is a thin sheath that is worn over the penis during sex. Condoms keep sperm from going inside a woman's body. They can be used with a spermicide to increase their effectiveness. They should be disposed after a single use. Female condom A female condom is a soft, loose-fitting sheath that is put into the vagina before sex. The condom keeps sperm from going inside a woman's body. They should be disposed after a single use. Diaphragm A diaphragm is a soft, dome-shaped barrier. It is inserted into the vagina before sex, along with a spermicide. The diaphragm blocks sperm from entering the uterus, and the spermicide kills sperm. A diaphragm should be left in the vagina for 6-8 hours after sex and removed within 24 hours. A diaphragm is prescribed and fitted by a health care provider. A diaphragm should be replaced every 1-2 years, after giving birth, after gaining more than 15 lb (6.8 kg), and after pelvic surgery. Cervical cap A cervical cap is a round, soft latex or plastic cup that fits over the cervix. It is  inserted into the vagina before sex, along with spermicide. It blocks sperm from entering the uterus. The cap should be left in place for 6-8 hours after sex and removed within 48 hours. A cervical cap must be prescribed and fitted by a health care provider. It should be replaced every 2 years. Sponge A sponge is a soft, circular piece of polyurethane foam with spermicide on it. The sponge helps block sperm from entering the uterus, and the spermicide kills sperm. To use it, you make it wet and then insert it into the vagina. It should be inserted before sex, left in for at least 6 hours after sex, and removed and thrown away within 30 hours. Spermicides Spermicides are chemicals that kill or block sperm from entering the cervix and uterus. They can come as a cream, jelly, suppository, foam, or tablet. A spermicide should be inserted into the vagina with an applicator at least 10-15 minutes before sex to allow time for it to work. The process must be repeated every time you have sex. Spermicides do not require a prescription. Intrauterine contraception Intrauterine device (IUD) An IUD is a T-shaped device that is put in a woman's uterus. There are two types:  Hormone IUD.This type contains progestin, a synthetic form of the hormone progesterone. This type can stay in place for 3-5 years.  Copper IUD.This type is wrapped in copper wire. It can stay in place for 10 years.  Permanent methods of contraception Female tubal ligation In this method, a woman's fallopian tubes are sealed, tied, or blocked during surgery to prevent eggs from traveling to the uterus. Hysteroscopic sterilization In this method, a small, flexible insert is placed into each fallopian tube. The inserts cause scar tissue to form in the fallopian tubes and block them, so sperm cannot reach an egg. The procedure takes about 3 months to be effective. Another form of birth control must be used during those 3 months. Female  sterilization This is a procedure to tie off the tubes that carry sperm (vasectomy). After the procedure, the man can still ejaculate fluid (semen). Natural planning methods Natural family planning In this method, a couple does not have sex on days when the woman could become pregnant. Calendar method This means keeping track of the length of each   menstrual cycle, identifying the days when pregnancy can happen, and not having sex on those days. Ovulation method In this method, a couple avoids sex during ovulation. Symptothermal method This method involves not having sex during ovulation. The woman typically checks for ovulation by watching changes in her temperature and in the consistency of cervical mucus. Post-ovulation method In this method, a couple waits to have sex until after ovulation. Summary  Contraception, also called birth control, means methods or devices that prevent pregnancy.  Hormonal methods of contraception include implants, injections, pills, patches, vaginal rings, and emergency contraceptives.  Barrier methods of contraception can include female condoms, female condoms, diaphragms, cervical caps, sponges, and spermicides.  There are two types of IUDs (intrauterine devices). An IUD can be put in a woman's uterus to prevent pregnancy for 3-5 years.  Permanent sterilization can be done through a procedure for males, females, or both.  Natural family planning methods involve not having sex on days when the woman could become pregnant. This information is not intended to replace advice given to you by your health care provider. Make sure you discuss any questions you have with your health care provider. Document Released: 08/21/2005 Document Revised: 09/23/2016 Document Reviewed: 09/23/2016 [employer] Interactive Patient Education  2018 [employer]   Breastfeeding Choosing to breastfeed is one of the best decisions you can make for yourself and your baby. A change in  hormones during pregnancy causes your breasts to make breast milk in your milk-producing glands. Hormones prevent breast milk from being released before your baby is born. They also prompt milk flow after birth. Once breastfeeding has begun, thoughts of your baby, as well as his or her sucking or crying, can stimulate the release of milk from your milk-producing glands. Benefits of breastfeeding Research shows that breastfeeding offers many health benefits for infants and mothers. It also offers a cost-free and convenient way to feed your baby. For your baby  Your first milk (colostrum) helps your baby's digestive system to function better.  Special cells in your milk (antibodies) help your baby to fight off infections.  Breastfed babies are less likely to develop asthma, allergies, obesity, or type 2 diabetes. They are also at lower risk for sudden infant death syndrome (SIDS).  Nutrients in breast milk are better able to meet your baby's needs compared to infant formula.  Breast milk improves your baby's brain development. For you  Breastfeeding helps to create a very special bond between you and your baby.  Breastfeeding is convenient. Breast milk costs nothing and is always available at the correct temperature.  Breastfeeding helps to burn calories. It helps you to lose the weight that you gained during pregnancy.  Breastfeeding makes your uterus return faster to its size before pregnancy. It also slows bleeding (lochia) after you give birth.  Breastfeeding helps to lower your risk of developing type 2 diabetes, osteoporosis, rheumatoid arthritis, cardiovascular disease, and breast, ovarian, uterine, and endometrial cancer later in life. Breastfeeding basics Starting breastfeeding  Find a comfortable place to sit or lie down, with your neck and back well-supported.  Place a pillow or a rolled-up blanket under your baby to bring him or her to the level of your breast (if you are  seated). Nursing pillows are specially designed to help support your arms and your baby while you breastfeed.  Make sure that your baby's tummy (abdomen) is facing your abdomen.  Gently massage your breast. With your fingertips, massage from the outer edges of your breast inward   toward the nipple. This encourages milk flow. If your milk flows slowly, you may need to continue this action during the feeding.  Support your breast with 4 fingers underneath and your thumb above your nipple (make the letter "C" with your hand). Make sure your fingers are well away from your nipple and your baby's mouth.  Stroke your baby's lips gently with your finger or nipple.  When your baby's mouth is open wide enough, quickly bring your baby to your breast, placing your entire nipple and as much of the areola as possible into your baby's mouth. The areola is the colored area around your nipple. ? More areola should be visible above your baby's upper lip than below the lower lip. ? Your baby's lips should be opened and extended outward (flanged) to ensure an adequate, comfortable latch. ? Your baby's tongue should be between his or her lower gum and your breast.  Make sure that your baby's mouth is correctly positioned around your nipple (latched). Your baby's lips should create a seal on your breast and be turned out (everted).  It is common for your baby to suck about 2-3 minutes in order to start the flow of breast milk. Latching Teaching your baby how to latch onto your breast properly is very important. An improper latch can cause nipple pain, decreased milk supply, and poor weight gain in your baby. Also, if your baby is not latched onto your nipple properly, he or she may swallow some air during feeding. This can make your baby fussy. Burping your baby when you switch breasts during the feeding can help to get rid of the air. However, teaching your baby to latch on properly is still the best way to prevent  fussiness from swallowing air while breastfeeding. Signs that your baby has successfully latched onto your nipple  Silent tugging or silent sucking, without causing you pain. Infant's lips should be extended outward (flanged).  Swallowing heard between every 3-4 sucks once your milk has started to flow (after your let-down milk reflex occurs).  Muscle movement above and in front of his or her ears while sucking.  Signs that your baby has not successfully latched onto your nipple  Sucking sounds or smacking sounds from your baby while breastfeeding.  Nipple pain.  If you think your baby has not latched on correctly, slip your finger into the corner of your baby's mouth to break the suction and place it between your baby's gums. Attempt to start breastfeeding again. Signs of successful breastfeeding Signs from your baby  Your baby will gradually decrease the number of sucks or will completely stop sucking.  Your baby will fall asleep.  Your baby's body will relax.  Your baby will retain a small amount of milk in his or her mouth.  Your baby will let go of your breast by himself or herself.  Signs from you  Breasts that have increased in firmness, weight, and size 1-3 hours after feeding.  Breasts that are softer immediately after breastfeeding.  Increased milk volume, as well as a change in milk consistency and color by the fifth day of breastfeeding.  Nipples that are not sore, cracked, or bleeding.  Signs that your baby is getting enough milk  Wetting at least 1-2 diapers during the first 24 hours after birth.  Wetting at least 5-6 diapers every 24 hours for the first week after birth. The urine should be clear or pale yellow by the age of 5 days.  Wetting 6-8   diapers every 24 hours as your baby continues to grow and develop.  At least 3 stools in a 24-hour period by the age of 5 days. The stool should be soft and yellow.  At least 3 stools in a 24-hour period by the  age of 7 days. The stool should be seedy and yellow.  No loss of weight greater than 10% of birth weight during the first 3 days of life.  Average weight gain of 4-7 oz (113-198 g) per week after the age of 4 days.  Consistent daily weight gain by the age of 5 days, without weight loss after the age of 2 weeks. After a feeding, your baby may spit up a small amount of milk. This is normal. Breastfeeding frequency and duration Frequent feeding will help you make more milk and can prevent sore nipples and extremely full breasts (breast engorgement). Breastfeed when you feel the need to reduce the fullness of your breasts or when your baby shows signs of hunger. This is called "breastfeeding on demand." Signs that your baby is hungry include:  Increased alertness, activity, or restlessness.  Movement of the head from side to side.  Opening of the mouth when the corner of the mouth or cheek is stroked (rooting).  Increased sucking sounds, smacking lips, cooing, sighing, or squeaking.  Hand-to-mouth movements and sucking on fingers or hands.  Fussing or crying.  Avoid introducing a pacifier to your baby in the first 4-6 weeks after your baby is born. After this time, you may choose to use a pacifier. Research has shown that pacifier use during the first year of a baby's life decreases the risk of sudden infant death syndrome (SIDS). Allow your baby to feed on each breast as long as he or she wants. When your baby unlatches or falls asleep while feeding from the first breast, offer the second breast. Because newborns are often sleepy in the first few weeks of life, you may need to awaken your baby to get him or her to feed. Breastfeeding times will vary from baby to baby. However, the following rules can serve as a guide to help you make sure that your baby is properly fed:  Newborns (babies 4 weeks of age or younger) may breastfeed every 1-3 hours.  Newborns should not go without breastfeeding  for longer than 3 hours during the day or 5 hours during the night.  You should breastfeed your baby a minimum of 8 times in a 24-hour period.  Breast milk pumping Pumping and storing breast milk allows you to make sure that your baby is exclusively fed your breast milk, even at times when you are unable to breastfeed. This is especially important if you go back to work while you are still breastfeeding, or if you are not able to be present during feedings. Your lactation consultant can help you find a method of pumping that works best for you and give you guidelines about how long it is safe to store breast milk. Caring for your breasts while you breastfeed Nipples can become dry, cracked, and sore while breastfeeding. The following recommendations can help keep your breasts moisturized and healthy:  Avoid using soap on your nipples.  Wear a supportive bra designed especially for nursing. Avoid wearing underwire-style bras or extremely tight bras (sports bras).  Air-dry your nipples for 3-4 minutes after each feeding.  Use only cotton bra pads to absorb leaked breast milk. Leaking of breast milk between feedings is normal.  Use lanolin   on your nipples after breastfeeding. Lanolin helps to maintain your skin's normal moisture barrier. Pure lanolin is not harmful (not toxic) to your baby. You may also hand express a few drops of breast milk and gently massage that milk into your nipples and allow the milk to air-dry.  In the first few weeks after giving birth, some women experience breast engorgement. Engorgement can make your breasts feel heavy, warm, and tender to the touch. Engorgement peaks within 3-5 days after you give birth. The following recommendations can help to ease engorgement:  Completely empty your breasts while breastfeeding or pumping. You may want to start by applying warm, moist heat (in the shower or with warm, water-soaked hand towels) just before feeding or pumping. This  increases circulation and helps the milk flow. If your baby does not completely empty your breasts while breastfeeding, pump any extra milk after he or she is finished.  Apply ice packs to your breasts immediately after breastfeeding or pumping, unless this is too uncomfortable for you. To do this: ? Put ice in a plastic bag. ? Place a towel between your skin and the bag. ? Leave the ice on for 20 minutes, 2-3 times a day.  Make sure that your baby is latched on and positioned properly while breastfeeding.  If engorgement persists after 48 hours of following these recommendations, contact your health care provider or a lactation consultant. Overall health care recommendations while breastfeeding  Eat 3 healthy meals and 3 snacks every day. Well-nourished mothers who are breastfeeding need an additional 450-500 calories a day. You can meet this requirement by increasing the amount of a balanced diet that you eat.  Drink enough water to keep your urine pale yellow or clear.  Rest often, relax, and continue to take your prenatal vitamins to prevent fatigue, stress, and low vitamin and mineral levels in your body (nutrient deficiencies).  Do not use any products that contain nicotine or tobacco, such as cigarettes and e-cigarettes. Your baby may be harmed by chemicals from cigarettes that pass into breast milk and exposure to secondhand smoke. If you need help quitting, ask your health care provider.  Avoid alcohol.  Do not use illegal drugs or marijuana.  Talk with your health care provider before taking any medicines. These include over-the-counter and prescription medicines as well as vitamins and herbal supplements. Some medicines that may be harmful to your baby can pass through breast milk.  It is possible to become pregnant while breastfeeding. If birth control is desired, ask your health care provider about options that will be safe while breastfeeding your baby. Where to find more  information: [employer]: www.llli.org Contact a health care provider if:  You feel like you want to stop breastfeeding or have become frustrated with breastfeeding.  Your nipples are cracked or bleeding.  Your breasts are red, tender, or warm.  You have: ? Painful breasts or nipples. ? A swollen area on either breast. ? A fever or chills. ? Nausea or vomiting. ? Drainage other than breast milk from your nipples.  Your breasts do not become full before feedings by the fifth day after you give birth.  You feel sad and depressed.  Your baby is: ? Too sleepy to eat well. ? Having trouble sleeping. ? More than 1 week old and wetting fewer than 6 diapers in a 24-hour period. ? Not gaining weight by 5 days of age.  Your baby has fewer than 3 stools in a 24-hour period.    Your baby's skin or the white parts of his or her eyes become yellow. Get help right away if:  Your baby is overly tired (lethargic) and does not want to wake up and feed.  Your baby develops an unexplained fever. Summary  Breastfeeding offers many health benefits for infant and mothers.  Try to breastfeed your infant when he or she shows early signs of hunger.  Gently tickle or stroke your baby's lips with your finger or nipple to allow the baby to open his or her mouth. Bring the baby to your breast. Make sure that much of the areola is in your baby's mouth. Offer one side and burp the baby before you offer the other side.  Talk with your health care provider or lactation consultant if you have questions or you face problems as you breastfeed. This information is not intended to replace advice given to you by your health care provider. Make sure you discuss any questions you have with your health care provider. Document Released: 08/21/2005 Document Revised: 09/22/2016 Document Reviewed: 09/22/2016 Elsevier Interactive Patient Education  2018 Elsevier Inc.  

## 2017-11-13 NOTE — Addendum Note (Signed)
Addended by: Catalina AntiguaONSTANT, Edlin Ford on: 11/13/2017 04:16 PM   Modules accepted: Orders

## 2017-11-13 NOTE — Progress Notes (Signed)
  Subjective:    Holly Ray is a G2P1001 3567w5d being seen today for her first obstetrical visit.  Her obstetrical history is significant for previous uncomplicated pregnancy, pregnancy as a result of failed copper IUD. Patient does not intend to breast feed. Pregnancy history fully reviewed.  Patient reports some nausea which is improving.  Vitals:   11/13/17 1435  BP: 130/74  Pulse: 79  Weight: 160 lb (72.6 kg)    HISTORY: OB History  Gravida Para Term Preterm AB Living  2 1 1     1   SAB TAB Ectopic Multiple Live Births               # Outcome Date GA Lbr Len/2nd Weight Sex Delivery Anes PTL Lv  2 Current           1 Term              History reviewed. No pertinent past medical history. History reviewed. No pertinent surgical history. Family History  Problem Relation Age of Onset  . Heart disease Father      Exam    Uterus:   16-week in size  Pelvic Exam:    Perineum: No Hemorrhoids, Normal Perineum   Vulva: normal   Vagina:  normal mucosa, normal discharge   pH:    Cervix: multiparous appearance   Adnexa: normal adnexa and no mass, fullness, tenderness   Bony Pelvis: gynecoid  System: Breast:  normal appearance, no masses or tenderness   Skin: normal coloration and turgor, no rashes    Neurologic: oriented, no focal deficits   Extremities: normal strength, tone, and muscle mass   HEENT extra ocular movement intact   Mouth/Teeth mucous membranes moist, pharynx normal without lesions and dental hygiene good   Neck supple and no masses   Cardiovascular: regular rate and rhythm   Respiratory:  appears well, vitals normal, no respiratory distress, acyanotic, normal RR, ear and throat exam is normal, neck free of mass or lymphadenopathy, chest clear, no wheezing, crepitations, rhonchi, normal symmetric air entry   Abdomen: soft, non-tender; bowel sounds normal; no masses,  no organomegaly   Urinary:       Assessment:    Pregnancy: G2P1001 Patient Active  Problem List   Diagnosis Date Noted  . Supervision of other normal pregnancy, antepartum 11/13/2017  . IUD failure, pregnant 08/16/2017  . Cellulitis and abscess of leg 02/15/2016  . Chronic interstitial cystitis 11/30/2015        Plan:     Initial labs drawn. Prenatal vitamins. Problem list reviewed and updated. Genetic Screening discussed : NIPS ordered.  Ultrasound discussed; fetal survey: ordered.  Follow up in 4 weeks. 50% of 30 min visit spent on counseling and coordination of care.     Holly Ray 11/13/2017

## 2017-11-14 LAB — OBSTETRIC PANEL
Antibody Screen: NOT DETECTED
BASOS ABS: 29 {cells}/uL (ref 0–200)
BASOS PCT: 0.2 %
EOS ABS: 86 {cells}/uL (ref 15–500)
EOS PCT: 0.6 %
HEMATOCRIT: 35.4 % (ref 35.0–45.0)
HEP B S AG: NONREACTIVE
Hemoglobin: 12 g/dL (ref 11.7–15.5)
LYMPHS ABS: 2002 {cells}/uL (ref 850–3900)
MCH: 27.5 pg (ref 27.0–33.0)
MCHC: 33.9 g/dL (ref 32.0–36.0)
MCV: 81 fL (ref 80.0–100.0)
MONOS PCT: 5.6 %
MPV: 12.3 fL (ref 7.5–12.5)
Neutro Abs: 11477 cells/uL — ABNORMAL HIGH (ref 1500–7800)
Neutrophils Relative %: 79.7 %
PLATELETS: 275 10*3/uL (ref 140–400)
RBC: 4.37 10*6/uL (ref 3.80–5.10)
RDW: 12.9 % (ref 11.0–15.0)
RPR Ser Ql: NONREACTIVE
Rubella: 1.13 index
Total Lymphocyte: 13.9 %
WBC mixed population: 806 cells/uL (ref 200–950)
WBC: 14.4 10*3/uL — ABNORMAL HIGH (ref 3.8–10.8)

## 2017-11-14 LAB — GC/CHLAMYDIA PROBE AMP (~~LOC~~) NOT AT ARMC
Chlamydia: NEGATIVE
NEISSERIA GONORRHEA: NEGATIVE

## 2017-11-14 LAB — HIV ANTIBODY (ROUTINE TESTING W REFLEX): HIV 1&2 Ab, 4th Generation: NONREACTIVE

## 2017-11-15 ENCOUNTER — Other Ambulatory Visit: Payer: Self-pay | Admitting: Obstetrics and Gynecology

## 2017-11-15 DIAGNOSIS — Z3480 Encounter for supervision of other normal pregnancy, unspecified trimester: Secondary | ICD-10-CM

## 2017-11-15 LAB — CULTURE, OB URINE

## 2017-11-15 LAB — CYTOLOGY - PAP: Diagnosis: NEGATIVE

## 2017-11-15 LAB — URINE CULTURE, OB REFLEX

## 2017-11-23 ENCOUNTER — Encounter (HOSPITAL_COMMUNITY): Payer: Self-pay | Admitting: Obstetrics and Gynecology

## 2017-11-26 ENCOUNTER — Telehealth: Payer: Self-pay | Admitting: Obstetrics and Gynecology

## 2017-11-26 NOTE — Telephone Encounter (Signed)
P called regarding her normal PN labs.  The Panorama has not resulted yet.  Will notify patient when it becomes available.

## 2017-11-26 NOTE — Telephone Encounter (Signed)
Pt called for test results from 11/13/17. Please call pt [phone].

## 2017-11-27 ENCOUNTER — Encounter: Payer: Self-pay | Admitting: *Deleted

## 2017-11-29 ENCOUNTER — Other Ambulatory Visit: Payer: Self-pay | Admitting: Obstetrics and Gynecology

## 2017-11-29 ENCOUNTER — Ambulatory Visit (HOSPITAL_COMMUNITY)
Admission: RE | Admit: 2017-11-29 | Discharge: 2017-11-29 | Disposition: A | Discharge status: Home / Self Care | Payer: Medicaid Other | Source: Ambulatory Visit | Attending: Obstetrics and Gynecology | Admitting: Obstetrics and Gynecology

## 2017-11-29 DIAGNOSIS — Z3689 Encounter for other specified antenatal screening: Secondary | ICD-10-CM

## 2017-11-29 DIAGNOSIS — Z3480 Encounter for supervision of other normal pregnancy, unspecified trimester: Secondary | ICD-10-CM

## 2017-11-29 DIAGNOSIS — Z3A19 19 weeks gestation of pregnancy: Secondary | ICD-10-CM | POA: Diagnosis not present

## 2017-12-11 ENCOUNTER — Ambulatory Visit (INDEPENDENT_AMBULATORY_CARE_PROVIDER_SITE_OTHER): Payer: Medicaid Other | Admitting: Obstetrics and Gynecology

## 2017-12-11 ENCOUNTER — Encounter: Payer: Self-pay | Admitting: Obstetrics and Gynecology

## 2017-12-11 VITALS — BP 117/71 | HR 73 | Wt 167.0 lb

## 2017-12-11 DIAGNOSIS — Z3480 Encounter for supervision of other normal pregnancy, unspecified trimester: Secondary | ICD-10-CM

## 2017-12-11 DIAGNOSIS — Z3482 Encounter for supervision of other normal pregnancy, second trimester: Secondary | ICD-10-CM | POA: Diagnosis not present

## 2017-12-11 NOTE — Progress Notes (Signed)
   PRENATAL VISIT NOTE  Subjective:  Holly Ray is a 22 y.o. G2P1001 at [redacted]w[redacted]d being seen today for ongoing prenatal care.  She is currently monitored for the following issues for this low-risk pregnancy and has Chronic interstitial cystitis; Cellulitis and abscess of leg; IUD failure, pregnant; and Supervision of other normal pregnancy, antepartum on their problem list.  Patient reports no complaints.  Contractions: Not present. Vag. Bleeding: None.  Movement: Present. Denies leaking of fluid.   The following portions of the patient's history were reviewed and updated as appropriate: allergies, current medications, past family history, past medical history, past social history, past surgical history and problem list. Problem list updated.  Objective:   Vitals:   12/11/17 1356  BP: 117/71  Pulse: 73  Weight: 167 lb (75.8 kg)    Fetal Status: Fetal Heart Rate (bpm): 147 Fundal Height: 20 cm Movement: Present     General:  Alert, oriented and cooperative. Patient is in no acute distress.  Skin: Skin is warm and dry. No rash noted.   Cardiovascular: Normal heart rate noted  Respiratory: Normal respiratory effort, no problems with respiration noted  Abdomen: Soft, gravid, appropriate for gestational age.  Pain/Pressure: Absent     Pelvic: Cervical exam deferred        Extremities: Normal range of motion.  Edema: None  Mental Status: Normal mood and affect. Normal behavior. Normal judgment and thought content.   Assessment and Plan:  Pregnancy: G2P1001 at [redacted]w[redacted]d  1. Supervision of other normal pregnancy, antepartum Patient is doing well without complaints Reviewed anatomy ultrasound report- follow-up ordered to complete anatomy AFP today [facility] had a good experience with that hospital with her last pregnancy but plans to continue prenatal care with us - US MFM OB FOLLOW UP; Future - AFP, Serum, Open Spina Bifida  Preterm labor symptoms  and general obstetric precautions including but not limited to vaginal bleeding, contractions, leaking of fluid and fetal movement were reviewed in detail with the patient. Please refer to After Visit Summary for other counseling recommendations.  Return in about 1 month (around 01/08/2018) for ROB.  No future appointments.  Catalina AntiguaPeggy Kristan Brummitt, MD

## 2017-12-13 LAB — AFP, SERUM, OPEN SPINA BIFIDA
AFP MOM: 1.29
AFP Value: 72.3 ng/mL
GEST. AGE ON COLLECTION DATE: 20.5 wk
Maternal Age At EDD: 22.8 yr
OSBR RISK 1 IN: 4947
TEST RESULTS AFP: NEGATIVE
Weight: 167 [lb_av]

## 2017-12-31 ENCOUNTER — Ambulatory Visit (HOSPITAL_COMMUNITY): Payer: Medicaid Other | Attending: Obstetrics and Gynecology

## 2018-01-08 ENCOUNTER — Encounter: Payer: Medicaid Other | Admitting: Obstetrics & Gynecology

## 2018-01-24 ENCOUNTER — Encounter: Payer: Medicaid Other | Admitting: Obstetrics & Gynecology

## 2018-01-31 ENCOUNTER — Ambulatory Visit (INDEPENDENT_AMBULATORY_CARE_PROVIDER_SITE_OTHER): Payer: Medicaid Other | Admitting: Obstetrics & Gynecology

## 2018-01-31 DIAGNOSIS — Z23 Encounter for immunization: Secondary | ICD-10-CM | POA: Diagnosis not present

## 2018-01-31 DIAGNOSIS — Z3480 Encounter for supervision of other normal pregnancy, unspecified trimester: Secondary | ICD-10-CM

## 2018-01-31 DIAGNOSIS — Z3483 Encounter for supervision of other normal pregnancy, third trimester: Secondary | ICD-10-CM

## 2018-01-31 NOTE — Progress Notes (Signed)
Pt c/o occasional stomach pains. States it feels like braxton hicks

## 2018-01-31 NOTE — Progress Notes (Signed)
   PRENATAL VISIT NOTE  Subjective:  Holly Ray is a 22 y.o. G2P1001 at [redacted]w[redacted]d being seen today for ongoing prenatal care.  She is currently monitored for the following issues for this low-risk pregnancy and has Chronic interstitial cystitis; Cellulitis and abscess of leg; IUD failure, pregnant; and Supervision of other normal pregnancy, antepartum on their problem list.  Patient reports no complaints.  Contractions: Irritability. Vag. Bleeding: None.  Movement: Present. Denies leaking of fluid.   The following portions of the patient's history were reviewed and updated as appropriate: allergies, current medications, past family history, past medical history, past social history, past surgical history and problem list. Problem list updated.  Objective:   Vitals:   01/31/18 1512  BP: 123/67  Pulse: 80  Weight: 178 lb (80.7 kg)    Fetal Status: Fetal Heart Rate (bpm): 155   Movement: Present     General:  Alert, oriented and cooperative. Patient is in no acute distress.  Skin: Skin is warm and dry. No rash noted.   Cardiovascular: Normal heart rate noted  Respiratory: Normal respiratory effort, no problems with respiration noted  Abdomen: Soft, gravid, appropriate for gestational age.  Pain/Pressure: Present     Pelvic: Cervical exam deferred        Extremities: Normal range of motion.  Edema: None  Mental Status: Normal mood and affect. Normal behavior. Normal judgment and thought content.   Assessment and Plan:  Pregnancy: G2P1001 at [redacted]w[redacted]d  1. Supervision of other normal pregnancy, antepartum - 2 hour GTT and labs next week - Tdap vaccine greater than or equal to 7yo IM  Preterm labor symptoms and general obstetric precautions including but not limited to vaginal bleeding, contractions, leaking of fluid and fetal movement were reviewed in detail with the patient. Please refer to After Visit Summary for other counseling recommendations.  No follow-ups on file.  No future  appointments.  Allie Bossier, MD

## 2018-02-11 ENCOUNTER — Other Ambulatory Visit: Payer: Medicaid Other

## 2018-02-13 ENCOUNTER — Other Ambulatory Visit: Payer: Medicaid Other

## 2018-02-13 DIAGNOSIS — Z3480 Encounter for supervision of other normal pregnancy, unspecified trimester: Secondary | ICD-10-CM

## 2018-02-14 LAB — CBC
HEMATOCRIT: 35.5 % (ref 35.0–45.0)
Hemoglobin: 11.9 g/dL (ref 11.7–15.5)
MCH: 28.3 pg (ref 27.0–33.0)
MCHC: 33.5 g/dL (ref 32.0–36.0)
MCV: 84.5 fL (ref 80.0–100.0)
MPV: 11.3 fL (ref 7.5–12.5)
Platelets: 236 10*3/uL (ref 140–400)
RBC: 4.2 10*6/uL (ref 3.80–5.10)
RDW: 12.5 % (ref 11.0–15.0)
WBC: 11.8 10*3/uL — AB (ref 3.8–10.8)

## 2018-02-14 LAB — 2HR GTT W 1 HR, CARPENTER, 75 G
GLUCOSE, 1 HR, GEST: 103 mg/dL (ref 65–179)
GLUCOSE, FASTING, GEST: 68 mg/dL (ref 65–91)
Glucose, 2 Hr, Gest: 97 mg/dL (ref 65–152)

## 2018-02-14 LAB — RPR: RPR: NONREACTIVE

## 2018-02-14 LAB — HIV ANTIBODY (ROUTINE TESTING W REFLEX): HIV: NONREACTIVE

## 2018-02-21 ENCOUNTER — Encounter: Payer: Medicaid Other | Admitting: Obstetrics & Gynecology

## 2018-03-29 ENCOUNTER — Ambulatory Visit (INDEPENDENT_AMBULATORY_CARE_PROVIDER_SITE_OTHER): Payer: Medicaid Other | Admitting: Advanced Practice Midwife

## 2018-03-29 ENCOUNTER — Other Ambulatory Visit (HOSPITAL_COMMUNITY)
Admission: RE | Admit: 2018-03-29 | Discharge: 2018-03-29 | Disposition: A | Discharge status: Home / Self Care | Payer: Medicaid Other | Source: Ambulatory Visit | Attending: Obstetrics & Gynecology | Admitting: Obstetrics & Gynecology

## 2018-03-29 VITALS — BP 133/77 | HR 78 | Wt 184.0 lb

## 2018-03-29 DIAGNOSIS — Z3483 Encounter for supervision of other normal pregnancy, third trimester: Secondary | ICD-10-CM

## 2018-03-29 DIAGNOSIS — Z3480 Encounter for supervision of other normal pregnancy, unspecified trimester: Secondary | ICD-10-CM

## 2018-03-29 DIAGNOSIS — Z3A36 36 weeks gestation of pregnancy: Secondary | ICD-10-CM | POA: Insufficient documentation

## 2018-03-29 MED ORDER — CONCEPT OB 130-92.4-1 MG PO CAPS: 1.0000 | ORAL_CAPSULE | Freq: Every day | ORAL | 12 refills | Status: AC

## 2018-03-29 NOTE — Progress Notes (Signed)
   PRENATAL VISIT NOTE  Subjective:  Holly Ray is a 22 y.o. G2P1001 at 4946w1d being seen today for ongoing prenatal care.  She is currently monitored for the following issues for this low-risk pregnancy and has Chronic interstitial cystitis; Cellulitis and abscess of leg; IUD failure, pregnant; and Supervision of other normal pregnancy, antepartum on their problem list.  Patient reports no complaints.  Contractions: Not present. Vag. Bleeding: None.  Movement: Present. Denies leaking of fluid.   The following portions of the patient's history were reviewed and updated as appropriate: allergies, current medications, past family history, past medical history, past social history, past surgical history and problem list. Problem list updated.  [facility] Too late to transfer.   Objective:   Vitals:   03/29/18 0956  BP: 133/77  Pulse: 78  Weight: 184 lb (83.5 kg)    Fetal Status: Fetal Heart Rate (bpm): 145 Fundal Height: 36 cm Movement: Present  Presentation: Vertex  General:  Alert, oriented and cooperative. Patient is in no acute distress.  Skin: Skin is warm and dry. No rash noted.   Cardiovascular: Normal heart rate noted  Respiratory: Normal respiratory effort, no problems with respiration noted  Abdomen: Soft, gravid, appropriate for gestational age.  Pain/Pressure: Present     Pelvic: Cervical exam performed Dilation: 1 Effacement (%): 0 Station: Ballotable  Extremities: Normal range of motion.  Edema: Trace  Mental Status: Normal mood and affect. Normal behavior. Normal judgment and thought content.   Assessment and Plan:  Pregnancy: G2P1001 at 4146w1d  1. Supervision of other normal pregnancy, antepartum  - Culture, beta strep (group b only) - Urine cytology ancillary only - Prenat w/o A Vit-FeFum-FePo-FA (CONCEPT OB) 130-92.4-1 MG CAPS; Take 1 tablet by mouth daily.  Dispense: 30 capsule; Refill: 12 - Give copy of records (Pt left without  them)  Term labor symptoms and general obstetric precautions including but not limited to vaginal bleeding, contractions, leaking of fluid and fetal movement were reviewed in detail with the patient. Please refer to After Visit Summary for other counseling recommendations.  Return in about 1 week (around 04/05/2018) for ROB.  Future Appointments  Date Time Provider Department Center  04/04/2018  1:45 PM Willodean RosenthalHarraway-Kirk Sampley, Carolyn, MD [facility]    Dorathy Kinsman[place]ia Abram Sax, [place]

## 2018-04-01 LAB — CULTURE, BETA STREP (GROUP B ONLY)
MICRO NUMBER: 90886797
SPECIMEN QUALITY:: ADEQUATE

## 2018-04-03 LAB — URINE CYTOLOGY ANCILLARY ONLY
Chlamydia: NEGATIVE
Neisseria Gonorrhea: NEGATIVE

## 2018-04-04 ENCOUNTER — Encounter: Payer: Medicaid Other | Admitting: Obstetrics & Gynecology

## 2018-07-16 ENCOUNTER — Telehealth: Payer: Self-pay

## 2018-07-16 NOTE — Telephone Encounter (Signed)
Pt called requesting med refill for amtriptyline. As per pt, rx was given by GYN doctor. Pt is requesting if [facility]. Pt would come in for an appt but has 5 children and traveling is a difficult task. Pls advise, thanks.

## 2018-07-17 NOTE — Telephone Encounter (Signed)
Need to know dose of meds Has been almost a year since she's been seen here. I'll send enough for 3 mos but in that time he needs to come see me for a checkup

## 2018-07-17 NOTE — Telephone Encounter (Signed)
As per pt, dose she is taking is 50 mg, 2 tabs prn.

## 2018-07-18 ENCOUNTER — Encounter (HOSPITAL_COMMUNITY): Payer: Self-pay

## 2018-07-18 MED ORDER — AMITRIPTYLINE HCL 25 MG PO TABS: 25.0000 mg | ORAL_TABLET | Freq: Every evening | ORAL | 0 refills | Status: AC | PRN

## 2018-07-18 NOTE — Telephone Encounter (Signed)
Sent!
